# Patient Record
Sex: Male | Born: 1970
Health system: Southern US, Community
[De-identification: ages and names within clinical notes are randomized; demographics above are authoritative.]

## PROBLEM LIST (undated history)

## (undated) DIAGNOSIS — F419 Anxiety disorder, unspecified: Secondary | ICD-10-CM

## (undated) DIAGNOSIS — G9332 Myalgic encephalomyelitis/chronic fatigue syndrome: Secondary | ICD-10-CM

## (undated) DIAGNOSIS — M109 Gout, unspecified: Secondary | ICD-10-CM

## (undated) DIAGNOSIS — M25569 Pain in unspecified knee: Secondary | ICD-10-CM

## (undated) DIAGNOSIS — E119 Type 2 diabetes mellitus without complications: Secondary | ICD-10-CM

## (undated) DIAGNOSIS — M2550 Pain in unspecified joint: Secondary | ICD-10-CM

## (undated) DIAGNOSIS — I10 Essential (primary) hypertension: Secondary | ICD-10-CM

## (undated) HISTORY — DX: Essential (primary) hypertension: I10

## (undated) HISTORY — DX: Myalgic encephalomyelitis/chronic fatigue syndrome: G93.32

## (undated) HISTORY — DX: Anxiety disorder, unspecified: F41.9

## (undated) HISTORY — PX: BACK SURGERY: SHX140

## (undated) HISTORY — DX: Type 2 diabetes mellitus without complications: E11.9

## (undated) HISTORY — DX: Pain in unspecified knee: M25.569

## (undated) HISTORY — DX: Pain in unspecified joint: M25.50

---

## 2012-04-21 ENCOUNTER — Ambulatory Visit (HOSPITAL_BASED_OUTPATIENT_CLINIC_OR_DEPARTMENT_OTHER): Payer: BC Managed Care – PPO | Attending: Family Medicine | Admitting: Radiology

## 2012-04-21 VITALS — Ht 74.0 in | Wt 365.0 lb

## 2012-04-21 DIAGNOSIS — G4733 Obstructive sleep apnea (adult) (pediatric): Secondary | ICD-10-CM | POA: Insufficient documentation

## 2012-04-26 DIAGNOSIS — G4733 Obstructive sleep apnea (adult) (pediatric): Secondary | ICD-10-CM

## 2012-04-26 NOTE — Procedures (Signed)
[name], [name]                ACCOUNT NO.:  [id number]  MEDICAL RECORD NO.:  [id number]          PATIENT TYPE:  OUT  LOCATION:  SLEEP CENTER                 FACILITY:  [facility]  PHYSICIAN:  Dontai Pember D. Maple Hudson, MD, FCCP, FACPDATE OF BIRTH:  07/30/68  DATE OF STUDY:  04/21/2012                           NOCTURNAL POLYSOMNOGRAM  REFERRING PHYSICIAN:  Windle Guard, M.D.  REFERRING PHYSICIAN:  Windle Guard, MD  INDICATION FOR STUDY:  Insomnia with sleep apnea.  EPWORTH SLEEPINESS SCORE:  12/24.  BMI 46.9, weight 365 pounds, height 74 inches, neck 20 inches.  MEDICATIONS:  Home medications are charted and reviewed.  SLEEP ARCHITECTURE:  Split study protocol.  During the diagnostic phase, total sleep time 120.5 minutes with sleep efficiency 74.8%.  Stage I was 5.4%, stage II 94.6%, stages III and REM were absent.  Sleep latency 31.5 minutes, awake after sleep onset 9 minutes.  Arousal index 12.9. Bedtime medication:  Tylenol PM.  RESPIRATORY DATA:  Split study protocol.  Apnea/hypopnea index (AHI) 106.6 per hour.  A total of 214 events was scored including 152 obstructive apneas, 5 mixed apneas, 57 hypopneas.  Events were not positional.  CPAP was titrated to 11 CWP, AHI 0 per hour.  He wore a medium ResMed Quattro FX full-face mask with heated humidifier.  OXYGEN DATA:  Before CPAP, snoring was moderately loud with oxygen desaturation to a nadir of 76% on room air.  With CPAP titration, snoring was prevented and mean oxygen saturation held 94.7% on room air.  CARDIAC DATA:  Sinus rhythm.  MOVEMENT/PARASOMNIA:  No significant movement disturbance of sleep.  No bathroom trips.  IMPRESSION/RECOMMENDATION: 1. Severe obstructive sleep apnea/hypopnea syndrome, AHI 106.6 per     hour with non-positional events.  Moderately loud snoring with     oxygen desaturation to a nadir of 76% on room air. 2. Successful CPAP titration to 11 CWP, AHI 0 per hour.  He wore a     medium ResMed  Quattro FX full-face mask with heated humidifier.     Snoring was prevented and mean oxygen saturation held 94.7% on room     air.     Cameron Hunter D. Maple Hudson, MD, [facility], FACP Diplomate, American Board of Sleep Medicine    CDY/MEDQ  D:  04/26/2012 11:59:38  T:  04/26/2012 22:44:33  Job:  [id number]

## 2017-11-13 ENCOUNTER — Encounter (HOSPITAL_COMMUNITY): Payer: Self-pay | Admitting: Emergency Medicine

## 2017-11-13 ENCOUNTER — Ambulatory Visit (HOSPITAL_COMMUNITY)
Admission: EM | Admit: 2017-11-13 | Discharge: 2017-11-13 | Disposition: A | Discharge status: Home / Self Care | Payer: 59 | Attending: Family Medicine | Admitting: Family Medicine

## 2017-11-13 DIAGNOSIS — M25561 Pain in right knee: Secondary | ICD-10-CM | POA: Diagnosis not present

## 2017-11-13 HISTORY — DX: Gout, unspecified: M10.9

## 2017-11-13 MED ORDER — INDOMETHACIN 50 MG PO CAPS: 50.0000 mg | ORAL_CAPSULE | Freq: Three times a day (TID) | ORAL | 0 refills | Status: AC

## 2017-11-13 MED ORDER — PREDNISONE 50 MG PO TABS: 50.0000 mg | ORAL_TABLET | Freq: Every day | ORAL | 0 refills | Status: DC

## 2017-11-13 NOTE — ED Triage Notes (Signed)
Pt sts right knee pain; pt sts took gout meds without relief

## 2017-11-13 NOTE — ED Provider Notes (Signed)
[facility]    CSN: [id number] Arrival date & time: 11/13/17  1508     History   Chief Complaint Chief Complaint  Patient presents with  . Knee Pain    HPI Cameron Hunter is a 47 y.o. male.   47 year old male comes in for 6 day history of right knee pain. States has had gout in the knee in the past, at the time of symptoms, had erythema, increased warmth, swelling and tender to touch. He did an e-visit and got 5 days of indomethacin with good relief. However, when he started up activity again with mild beer intake, pain started back up. States pain relieved by RICE, pain increased with weight bearing. Denies injury/trauma. States has ran out of indomethacin and came in for evaluation. Denies fever, chills, night sweats. Patient job requires long hours of walking/standing.     Past Medical History:  Diagnosis Date  . Gout     There are no active problems to display for this patient.   History reviewed. No pertinent surgical history.     Home Medications    Prior to Admission medications   Medication Sig Start Date End Date Taking? Authorizing Provider  indomethacin (INDOCIN) 50 MG capsule Take 1 capsule (50 mg total) by mouth 3 (three) times daily with meals for 7 days. 11/13/17 11/20/17  Cathie Hoops, Zakiya Sporrer V, PA-C  predniSONE (DELTASONE) 50 MG tablet Take 1 tablet (50 mg total) by mouth daily for 5 days. 11/13/17 11/18/17  Belinda Fisher, PA-C    Family History History reviewed. No pertinent family history.  Social History Social History   Tobacco Use  . Smoking status: Never Smoker  . Smokeless tobacco: Never Used  Substance Use Topics  . Alcohol use: Not Currently  . Drug use: Never     Allergies   Patient has no known allergies.   Review of Systems Review of Systems  Reason unable to perform ROS: See HPI as above.     Physical Exam Triage Vital Signs ED Triage Vitals [11/13/17 1528]  Enc Vitals Group     BP (!) 185/93     Pulse Rate 79     Resp 18    Temp 98.6 F (37 C)     Temp Source Oral     SpO2 100 %     Weight      Height      Head Circumference      Peak Flow      Pain Score      Pain Loc      Pain Edu?      Excl. in GC?    No data found.  Updated Vital Signs BP (!) 185/93 (BP Location: Left Arm)   Pulse 79   Temp 98.6 F (37 C) (Oral)   Resp 18   SpO2 100%   Physical Exam  Constitutional: He is oriented to person, place, and time. He appears well-developed and well-nourished. No distress.  HENT:  Head: Normocephalic and atraumatic.  Eyes: Pupils are equal, round, and reactive to light. Conjunctivae are normal.  Musculoskeletal:  No obvious swelling, erythema, increased warmth, contusion seen.  No obvious tenderness to palpation.  Full range of motion.  Strength normal and equal bilaterally.  Sensation intact and equal bilaterally.  Neurological: He is alert and oriented to person, place, and time.     UC Treatments / Results  Labs (all labs ordered are listed, but only abnormal results are displayed) Labs Reviewed -  No data to display  EKG None  Radiology No results found.  Procedures Procedures (including critical care time)  Medications Ordered in UC Medications - No data to display  Initial Impression / Assessment and Plan / UC Course  I have reviewed the triage vital signs and the nursing notes.  Pertinent labs & imaging results that were available during my care of the patient were reviewed by me and considered in my medical decision making (see chart for details).    Atraumatic knee pain, discussed possibility of residual gout, inflammation, arthritis causing symptoms. Will extend indomethacin treatment. Rx of prednisone as needed. Knee sleeve during activity. Return precautions given. Patient expresses understanding and agrees to plan.  Final Clinical Impressions(s) / UC Diagnoses   Final diagnoses:  Acute pain of right knee    ED Prescriptions    Medication Sig Dispense Auth.  Provider   indomethacin (INDOCIN) 50 MG capsule Take 1 capsule (50 mg total) by mouth 3 (three) times daily with meals for 7 days. 21 capsule Daymen Hassebrock V, PA-C   predniSONE (DELTASONE) 50 MG tablet Take 1 tablet (50 mg total) by mouth daily for 5 days. 5 tablet Threasa Alpha, [place] 11/13/17 1615

## 2017-11-13 NOTE — Discharge Instructions (Signed)
Start indomethacin as directed. Continue elevation, knee sleeve during activity. Prednisone if symptoms not improving. Follow up fore reevaluation if symptoms not improving.

## 2017-11-15 ENCOUNTER — Telehealth (HOSPITAL_COMMUNITY): Payer: Self-pay | Admitting: Family Medicine

## 2017-11-15 MED ORDER — PREDNISONE 50 MG PO TABS: 50.0000 mg | ORAL_TABLET | Freq: Every day | ORAL | 0 refills | Status: AC

## 2017-11-15 NOTE — Telephone Encounter (Signed)
Pt called wanting the prescription for prednisone. Reports that he was tols if the indomethacin didn't work to call and we would send the prednisone. Looked at the chart and appears that it was already sent to the pharmacy on the same date as the indomethacin. Pt reports that they didn't receive it. I will resend med to pharmacy for the patient. Pt aware and agreeable to plan.

## 2018-01-09 ENCOUNTER — Encounter (HOSPITAL_COMMUNITY): Payer: Self-pay | Admitting: Emergency Medicine

## 2018-01-09 ENCOUNTER — Ambulatory Visit (HOSPITAL_COMMUNITY)
Admission: EM | Admit: 2018-01-09 | Discharge: 2018-01-09 | Disposition: A | Discharge status: Home / Self Care | Payer: 59 | Attending: Family Medicine | Admitting: Family Medicine

## 2018-01-09 DIAGNOSIS — M25561 Pain in right knee: Secondary | ICD-10-CM

## 2018-01-09 MED ORDER — PREDNISONE 10 MG (21) PO TBPK: ORAL_TABLET | Freq: Every day | ORAL | 0 refills | Status: DC

## 2018-01-09 NOTE — ED Triage Notes (Signed)
PT reports right knee pain for over a week. PT's doctor increased his gout med and it has not helped. No known injury.

## 2018-01-23 NOTE — ED Provider Notes (Signed)
[facility]   [id number] 01/09/18 Arrival Time: 1726  ASSESSMENT & PLAN:  1. Acute pain of right knee     Meds ordered this encounter  Medications  . predniSONE (STERAPRED UNI-PAK 21 TAB) 10 MG (21) TBPK tablet    Sig: Take by mouth daily. Take as directed.    Dispense:  21 tablet    Refill:  0    Follow-up Information    Kaleen Mask, MD.   Specialty:  Family Medicine Why:  As needed. Contact information: [address] [place] [address] [phone]           Reviewed expectations re: course of current medical issues. Questions answered. Outlined signs and symptoms indicating need for more acute intervention. Patient verbalized understanding. After Visit Summary given.  SUBJECTIVE: History from: patient. Kartik Fernando is a 47 y.o. male who reports persistent moderate pain of his right knee that is gradually worsening; described as aching without radiation. Onset: gradual, over the past week but has had similar problem with this knee in the past. Injury/trama: no. Relieved by: nothing in particular. Worsened by: certain movements. Associated symptoms: none reported. Extremity sensation changes or weakness: none. Self treatment: tried OTCs without relief of pain. History of similar: yes His doctor recently gave Colchicine for suspected gout. This not helping.  ROS: As per HPI.   OBJECTIVE:  Vitals:   01/09/18 1740  BP: (!) 165/96  Pulse: 83  Resp: 16  Temp: 97.7 F (36.5 C)  TempSrc: Oral  SpO2: 97%  Weight: 129.3 kg    General appearance: alert; no distress Extremities: warm and well perfused; symmetrical with no gross deformities; poorly localized tenderness over his right knee with no swelling and no bruising; ROM: normal but with discomfort CV: normal extremity capillary refill Skin: warm and dry Neurologic: normal gait; normal symmetric reflexes in all extremities; normal sensation in all  extremities Psychological: alert and cooperative; normal mood and affect  No Known Allergies  Past Medical History:  Diagnosis Date  . Gout    Social History   Socioeconomic History  . Marital status: Married    Spouse name: Not on file  . Number of children: Not on file  . Years of education: Not on file  . Highest education level: Not on file  Occupational History  . Not on file  Social Needs  . Financial resource strain: Not on file  . Food insecurity:    Worry: Not on file    Inability: Not on file  . Transportation needs:    Medical: Not on file    Non-medical: Not on file  Tobacco Use  . Smoking status: Never Smoker  . Smokeless tobacco: Never Used  Substance and Sexual Activity  . Alcohol use: Not Currently  . Drug use: Never  . Sexual activity: Not on file  Lifestyle  . Physical activity:    Days per week: Not on file    Minutes per session: Not on file  . Stress: Not on file  Relationships  . Social connections:    Talks on phone: Not on file    Gets together: Not on file    Attends religious service: Not on file    Active member of club or organization: Not on file    Attends meetings of clubs or organizations: Not on file    Relationship status: Not on file  . Intimate partner violence:    Fear of current or ex partner: Not on file  Emotionally abused: Not on file    Physically abused: Not on file    Forced sexual activity: Not on file  Other Topics Concern  . Not on file  Social History Narrative  . Not on file   No family history on file. History reviewed. No pertinent surgical history.    Mardella LaymanHagler, Jessicia Napolitano, MD 01/28/18 [phone]

## 2018-05-14 ENCOUNTER — Emergency Department (HOSPITAL_COMMUNITY): Payer: 59

## 2018-05-14 ENCOUNTER — Encounter (HOSPITAL_COMMUNITY): Payer: Self-pay | Admitting: Emergency Medicine

## 2018-05-14 ENCOUNTER — Emergency Department (HOSPITAL_COMMUNITY)
Admission: EM | Admit: 2018-05-14 | Discharge: 2018-05-14 | Disposition: A | Discharge status: Home / Self Care | Payer: 59 | Attending: Emergency Medicine | Admitting: Emergency Medicine

## 2018-05-14 DIAGNOSIS — M25561 Pain in right knee: Secondary | ICD-10-CM | POA: Diagnosis not present

## 2018-05-14 DIAGNOSIS — M5416 Radiculopathy, lumbar region: Secondary | ICD-10-CM | POA: Insufficient documentation

## 2018-05-14 DIAGNOSIS — Z79899 Other long term (current) drug therapy: Secondary | ICD-10-CM | POA: Insufficient documentation

## 2018-05-14 MED ORDER — METHYLPREDNISOLONE SODIUM SUCC 125 MG IJ SOLR
125.0000 mg | Freq: Once | INTRAMUSCULAR | Status: AC
  Administered 2018-05-14: 125 mg via INTRAVENOUS
  Filled 2018-05-14: qty 2

## 2018-05-14 MED ORDER — ONDANSETRON HCL 4 MG/2ML IJ SOLN
4.0000 mg | Freq: Once | INTRAMUSCULAR | Status: AC
  Administered 2018-05-14: 4 mg via INTRAVENOUS
  Filled 2018-05-14: qty 2

## 2018-05-14 MED ORDER — HYDROMORPHONE HCL 1 MG/ML IJ SOLN
1.0000 mg | Freq: Once | INTRAMUSCULAR | Status: AC
  Administered 2018-05-14: 1 mg via INTRAVENOUS
  Filled 2018-05-14: qty 1

## 2018-05-14 MED ORDER — SODIUM CHLORIDE 0.9 % IV BOLUS
1000.0000 mL | Freq: Once | INTRAVENOUS | Status: AC
  Administered 2018-05-14: 1000 mL via INTRAVENOUS

## 2018-05-14 MED ORDER — OXYCODONE-ACETAMINOPHEN 5-325 MG PO TABS: 1.0000 | ORAL_TABLET | Freq: Four times a day (QID) | ORAL | 0 refills | Status: DC | PRN

## 2018-05-14 MED ORDER — KETOROLAC TROMETHAMINE 30 MG/ML IJ SOLN
30.0000 mg | Freq: Once | INTRAMUSCULAR | Status: AC
  Administered 2018-05-14: 30 mg via INTRAVENOUS
  Filled 2018-05-14: qty 1

## 2018-05-14 NOTE — Discharge Instructions (Signed)
You have been seen today for knee pain. There were no acute abnormalities on the x-rays, including no sign of fracture or dislocation.   Antiinflammatory medications: Take the indomethacin, as prescribed. Acetaminophen (generic for Tylenol): Should you continue to have additional pain while taking the indomethacin, you may add in acetaminophen as needed. Your daily total maximum amount of acetaminophen from all sources should be limited to 4000mg /day for persons without liver problems, or 2000mg /day for those with liver problems. Percocet: May take Percocet (oxycodone-acetaminophen) as needed for severe pain.  Do not drive or perform other dangerous activities while taking the Percocet.  Please note that each pill of Percocet contains 325 mg of acetaminophen (Tylenol) and the above dosage limits apply. Ice: May apply ice to the area over the next 24 hours for 15 minutes at a time to reduce swelling. Elevation: Keep the extremity elevated as often as possible to reduce pain and inflammation. Support: Wear the knee immobilizer for support and comfort. Wear this until pain resolves. You will be weight-bearing as tolerated, which means you can slowly start to put weight on the extremity and increase amount and frequency as pain allows. Follow up: Follow-up with the orthopedic specialist for any further management of this issue. Return: Return to the ED for numbness, weakness, increasing pain, overall worsening symptoms, loss of function, or if symptoms are not improving, you have tried to follow up with the orthopedic specialist, and have been unable to do so.  For prescription assistance, may try using prescription discount sites or apps, such as goodrx.com

## 2018-05-14 NOTE — ED Notes (Signed)
Pt verbalized understanding discharge instructions and denies any further needs or questions at this time. VS stable, ambulatory and steady gait.   See EDP assessment / note.   

## 2018-05-14 NOTE — ED Provider Notes (Signed)
[facility] EMERGENCY DEPARTMENT Provider Note   CSN: [id number] Arrival date & time: 05/14/18  2130     History   Chief Complaint Chief Complaint  Patient presents with  . Knee Pain    HPI Cameron Hunter is a 47 y.o. male.  HPI   Cameron Hunter is a 47 y.o. male, with a history of gout, presenting to the ED with right knee pain for the last 3 days. Pain is sharp, severe, nonradiating.  Patient states he began to have right lower back pain 2 days ago, moderate, radiating down the right leg.  Patient had cortisone injection performed on the right knee by Dr. Ranell Patrick at the beginning of this month, which improved his recurrent right knee pain, but pain became worse 3 days ago.  Patient was seen by a PA in Dr. Tobin Chad office this morning for his knee pain and was injected with a "numbing medication" in the right knee, but states this has not helped. He was reportedly told that he could come to the ED for "more immediate relief." Patient does state his pain is consistent with previous gout flares in terms of type of pain and onset.  He adds that his current pain is more intense, but he does admit he has not let any previous flares go on this long.Marland Kitchen  He has had gout flares in multiple joints of the lower extremities, though he has not had a gout flare in the knee in the past.  He has recently eaten some of his trigger foods, including pork.  Orthopedic PA saw the patient this morning also suspected possible gout flare as he prescribed the patient prednisone and indomethacin.  Denies fever/chills, abdominal pain, numbness, weakness, falls/trauma, changes in bowel or bladder function, saddle anesthesias, or any other complaints.   Past Medical History:  Diagnosis Date  . Gout     There are no active problems to display for this patient.   History reviewed. No pertinent surgical history.      Home Medications    Prior to Admission medications   Medication Sig  Start Date End Date Taking? Authorizing Provider  Glucosamine 750 MG TABS Take 1,500 mg by mouth daily.   Yes [provider]  HYDROcodone-acetaminophen (NORCO/VICODIN) 5-325 MG tablet Take 1 tablet by mouth 4 (four) times daily as needed for pain. 05/13/18  Yes [provider]  Melatonin 5 MG TABS Take 10-15 mg by mouth at bedtime.   Yes [provider]  VOLTAREN 1 % GEL Apply 1 application topically daily as needed for pain. Right knee 04/24/18  Yes [provider]  oxyCODONE-acetaminophen (PERCOCET/ROXICET) 5-325 MG tablet Take 1-2 tablets by mouth every 6 (six) hours as needed for severe pain. 05/14/18   Nieves Barberi, Hillard Danker, PA-C    Family History No family history on file.  Social History Social History   Tobacco Use  . Smoking status: Never Smoker  . Smokeless tobacco: Never Used  Substance Use Topics  . Alcohol use: Not Currently  . Drug use: Never     Allergies   Patient has no known allergies.   Review of Systems Review of Systems  Constitutional: Negative for fever.  Respiratory: Negative for shortness of breath.   Cardiovascular: Negative for chest pain.  Gastrointestinal: Negative for nausea and vomiting.  Genitourinary: Negative for difficulty urinating.  Musculoskeletal: Positive for arthralgias. Negative for joint swelling.  Neurological: Negative for weakness and numbness.  All other systems reviewed and are negative.  Physical Exam Updated Vital Signs BP (!) 199/130 (BP Location: Left Arm)   Pulse 97   Temp 98.7 F (37.1 C) (Oral)   Resp 20   SpO2 97%   Physical Exam  Constitutional: He appears well-developed and well-nourished. No distress.  HENT:  Head: Normocephalic and atraumatic.  Eyes: Conjunctivae are normal.  Neck: Neck supple.  Cardiovascular: Normal rate, regular rhythm and intact distal pulses.  Pulmonary/Chest: Effort normal. No respiratory distress.  Abdominal: There is no guarding.  Musculoskeletal:  He exhibits tenderness. He exhibits no edema.  Some pain with range of motion of the right knee.  Range of motion intact.  Some tenderness throughout the right knee.  No swelling, erythema, increased warmth, deformity, or laxity noted. No pain with range of motion of the right hip. No tenderness in the lower back or hips.  Neurological: He is alert.  Sensation grossly intact to light touch in the lower extremities bilaterally. No saddle anesthesias. Strength 5/5 with flexion and extension at the bilateral hips, knees, and ankles. Slow, antalgic gait.  Skin: Skin is warm and dry. He is not diaphoretic.  Psychiatric: He has a normal mood and affect. His behavior is normal.  Nursing note and vitals reviewed.    ED Treatments / Results  Labs (all labs ordered are listed, but only abnormal results are displayed) Labs Reviewed - No data to display  EKG None  Radiology Dg Knee Complete 4 Views Right  Result Date: 05/14/2018 CLINICAL DATA:  Severe right knee pain. No response to steroids. No recent injury. EXAM: RIGHT KNEE - COMPLETE 4+ VIEW COMPARISON:  None. FINDINGS: The mineralization and alignment are normal. There is no evidence of acute fracture or dislocation. There is tricompartmental osteoarthritis, most advanced in the medial compartment where there is mild joint space narrowing and osteophyte formation. There is spurring at the quadriceps insertion on the patella. No significant joint effusion. IMPRESSION: Moderate for age tricompartmental osteoarthritis. No evidence of acute fracture or dislocation. Electronically Signed   By: Carey Bullocks M.D.   On: 05/14/2018 13:15    Procedures Procedures (including critical care time)  Medications Ordered in ED Medications  HYDROmorphone (DILAUDID) injection 1 mg (1 mg Intravenous Given 05/14/18 0951)  ondansetron (ZOFRAN) injection 4 mg (4 mg Intravenous Given 05/14/18 0951)  ketorolac (TORADOL) 30 MG/ML injection 30 mg (30 mg  Intravenous Given 05/14/18 1001)  sodium chloride 0.9 % bolus 1,000 mL (0 mLs Intravenous Stopped 05/14/18 1122)  methylPREDNISolone sodium succinate (SOLU-MEDROL) 125 mg/2 mL injection 125 mg (125 mg Intravenous Given 05/14/18 1018)  HYDROmorphone (DILAUDID) injection 1 mg (1 mg Intravenous Given 05/14/18 1122)  HYDROmorphone (DILAUDID) injection 1 mg (1 mg Intravenous Given 05/14/18 1327)     Initial Impression / Assessment and Plan / ED Course  I have reviewed the triage vital signs and the nursing notes.  Pertinent labs & imaging results that were available during my care of the patient were reviewed by me and considered in my medical decision making (see chart for details).  Clinical Course as of May 15 542  Wed May 14, 2018  0950 Pain improved somewhat, but then recurred.   [SJ]  1505 Spoke with Alphonsa Overall, orthopedic PA working with Dr. Ranell Patrick. Agrees with our plan and follow up.  They would be happy to see the patient in the office.   [SJ]    Clinical Course User Index [SJ] Jonai Weyland C, PA-C   Patient presents with right knee pain.  He has full  range of motion in the right knee.  Neurovascularly intact.  No features of the patient's back pain to suggest cauda equina or other emergent neurologic condition. Very low suspicion for septic joint as the exam and timeline are not suggestive.   Gout is much higher on my differential.  He had significant improvement in his pain over the ED course.  Findings and plan of care discussed with Marily MemosJason Mesner, MD.   The patient and I discussed his hypertension.  He does not seem to be symptomatic to it at this time.  He will follow-up with his PCP on this matter.  Final Clinical Impressions(s) / ED Diagnoses   Final diagnoses:  Acute pain of right knee    ED Discharge Orders         Ordered    oxyCODONE-acetaminophen (PERCOCET/ROXICET) 5-325 MG tablet  Every 6 hours PRN     05/14/18 1508           Anselm PancoastJoy, Parlee Amescua C, PA-C 05/15/18  0544    Mesner, Barbara CowerJason, MD 05/15/18 [phone]

## 2018-05-14 NOTE — ED Triage Notes (Signed)
Patient to ED c/o severe R knee pain - seen at his doctor for possible osteoarthritis and given steroid shot without relief. Reports the knee pain shoots up R leg to back, denies new injury. Denies numbness, but states unable to move/walk d/t severe pain.

## 2018-06-20 ENCOUNTER — Ambulatory Visit: Payer: 59 | Admitting: Neurology

## 2018-06-20 ENCOUNTER — Telehealth: Payer: Self-pay | Admitting: *Deleted

## 2018-06-20 ENCOUNTER — Encounter: Payer: Self-pay | Admitting: Neurology

## 2018-06-20 VITALS — BP 128/86 | HR 83

## 2018-06-20 DIAGNOSIS — M79604 Pain in right leg: Secondary | ICD-10-CM | POA: Diagnosis not present

## 2018-06-20 MED ORDER — MELOXICAM 15 MG PO TABS: 15.0000 mg | ORAL_TABLET | Freq: Every day | ORAL | 2 refills | Status: DC

## 2018-06-20 NOTE — Progress Notes (Signed)
Reason for visit: Right knee pain  Referring physician: Dr. Arthur HolmsNorris  Cameron Hunter is a 48 y.o. male  History of present illness:  [name] is a 48 year old right-handed white male with a history of onset of right knee pain that began in June 2019.  The patient does have a prior history of gout, initially the patient had a swollen hot right knee joint that seemed to respond to prednisone and indomethacin.  The patient felt better until August 2019 and he then had a second bout of right knee pain again with some warmth and swelling.  The patient was again treated with a cortisone injection which seemed to help.  He did well until 10 April 2018.  The patient began having severe pain at that point in the right knee, he had MRI of the knee but was told he had mild arthritic changes, nothing that would require surgery.  The patient had worsening the pain by 7 November and once again by 24 November the pain worsened.  The patient went to the emergency room on 27 November and received IV fluids and Dilaudid without much benefit.  The patient was placed on Lyrica by 27 May 2018.  He had MRI of the lumbar spine that did show evidence of mild to moderate spinal stenosis at the L2-3 level with possible impingement of the L3 and L4 nerve roots bilaterally.  The patient has gained benefit from the Lyrica after about 3 weeks of treatment.  He is feeling better with his right knee but he is still not ambulatory.  The patient comes in today in a wheelchair.  He denies numbness of the extremities, he denies neck pain or pain down the arms.  The patient denies any back pain whatsoever.  He has some achiness at times into the lower thigh on the right but he denies pain going from the back down the leg to the foot.  He reports no definite weakness of the extremities.  The patient is sent to this office for further evaluation.  The pain in the right knee is present with moving the knee and with  weightbearing.  Past Medical History:  Diagnosis Date  . Gout   . Knee pain    Right knee     History reviewed. No pertinent surgical history.  Family History  Problem Relation Age of Onset  . Lung cancer Mother   . Diabetes Mother   . COPD Mother   . Heart disease Mother   . Kidney disease Mother     Social history:  reports that he has never smoked. He has never used smokeless tobacco. He reports previous alcohol use. He reports that he does not use drugs.  Medications:  Prior to Admission medications   Medication Sig Start Date End Date Taking? Authorizing Provider  Glucosamine 750 MG TABS Take 1,500 mg by mouth daily.    [provider]  HYDROcodone-acetaminophen (NORCO/VICODIN) 5-325 MG tablet Take 1 tablet by mouth 4 (four) times daily as needed for pain. 05/13/18   [provider]  Melatonin 5 MG TABS Take 10-15 mg by mouth at bedtime.    [provider]  oxyCODONE-acetaminophen (PERCOCET/ROXICET) 5-325 MG tablet Take 1-2 tablets by mouth every 6 (six) hours as needed for severe pain. 05/14/18   Joy, Shawn C, PA-C  VOLTAREN 1 % GEL Apply 1 application topically daily as needed for pain. Right knee 04/24/18   [provider]     No Known Allergies  ROS:  Out of a complete 14 system review of symptoms, the patient complains only of the following symptoms, and all other reviewed systems are negative.  Right knee pain  Blood pressure 128/86, pulse 83, SpO2 95 %.  Physical Exam  General: The patient is alert and cooperative at the time of the examination.  Eyes: Pupils are equal, round, and reactive to light. Discs are flat bilaterally.  Neck: The neck is supple, no carotid bruits are noted.  Respiratory: The respiratory examination is clear.  Cardiovascular: The cardiovascular examination reveals a regular rate and rhythm, no obvious murmurs or rubs are noted.  Skin: Extremities are without significant edema.  There may be a  small amount of fluid in the right knee, but this is minimal, no definite warmth of the knee is seen.  Neurologic Exam  Mental status: The patient is alert and oriented x 3 at the time of the examination. The patient has apparent normal recent and remote memory, with an apparently normal attention span and concentration ability.  Cranial nerves: Facial symmetry is present. There is good sensation of the face to pinprick and soft touch bilaterally. The strength of the facial muscles and the muscles to head turning and shoulder shrug are normal bilaterally. Speech is well enunciated, no aphasia or dysarthria is noted. Extraocular movements are full. Visual fields are full. The tongue is midline, and the patient has symmetric elevation of the soft palate. No obvious hearing deficits are noted.  Motor: The motor testing reveals 5 over 5 strength of all 4 extremities. Good symmetric motor tone is noted throughout.  Sensory: Sensory testing is intact to pinprick, soft touch, vibration sensation, and position sense on all 4 extremities. No evidence of extinction is noted.  Coordination: Cerebellar testing reveals good finger-nose-finger and heel-to-shin bilaterally.  Gait and station: The patient is able stand and take a few steps, he is hesitant to ambulate longer distances.  Reflexes: Deep tendon reflexes are symmetric and normal bilaterally, with exception that the right knee jerk reflex appears to be depressed. Toes are downgoing bilaterally.   MRI lumbar 05/27/18:  1.  A transitional lumbosacral vertebra which is called L5 for the purposes of study.  If surgery or any other procedure is contemplated the numbering system that was used for the study should be carefully scrutinized. 2.  Multilevel degenerative changes of the lumbar spine, most prominent at L2-3. 3.  At L2-3, mild to moderate central canal stenosis with probable encroachment on the L3 nerve roots. 4.  At L3-4, a posterior disc  osteophyte complex with superimposed small central caudal disc extrusion.  The disc contacts but does not displace the L4 nerve roots. 5.  At L4-5, mild bilateral lateral recess stenosis.  No neural displacement or encroachment. 6.  Probable chronic lumbar spinous process impingement at L1-2, L2-3, and L3-4.  Recommend clinical correlation.   Assessment/Plan:  1.  Right knee discomfort  The clinical history appears to suggest an intrinsic knee joint problem.  The patient does have a history of gout, currently the right knee does not appear to be inflamed.  The pain level has been severe, the patient has not been ambulatory for several weeks.  The patient be placed on Mobic 15 mg daily.  He will remain on Lyrica, we will get him set up for an epidural steroid injection of the low back although I am not convinced that his low back issues are in any way associated with the right knee pain.  We  may potentially consider EMG nerve conduction study in the future depending upon how he does with the above therapy.  Blood work will be done today.  Marlan Palau MD 06/20/2018 8:55 AM  [facility] [address] [place], [place] [address]  Phone [phone] Fax [phone]

## 2018-06-20 NOTE — Telephone Encounter (Signed)
I contacted Cameron Hunter(pt wife ok per dpr) and advised she could bring FMLA paper work by for Dr. Anne Hahn to review and we would go from there. Pt's wife was agreeable and had no further questions MB RN

## 2018-06-20 NOTE — Telephone Encounter (Signed)
Diannia Ruder, patients wife, came to the front desk after the appointment today with questions about possibly getting a letter from Dr. Anne Hahn.  States she wonders if she needs it for FMLA?  Please call.

## 2018-06-23 ENCOUNTER — Other Ambulatory Visit: Payer: Self-pay

## 2018-06-23 ENCOUNTER — Other Ambulatory Visit: Payer: Self-pay | Admitting: Neurology

## 2018-06-23 ENCOUNTER — Telehealth: Payer: Self-pay

## 2018-06-23 DIAGNOSIS — M79604 Pain in right leg: Secondary | ICD-10-CM

## 2018-06-23 LAB — COMPREHENSIVE METABOLIC PANEL
A/G RATIO: 1.9 (ref 1.2–2.2)
ALBUMIN: 4.2 g/dL (ref 3.5–5.5)
ALK PHOS: 57 IU/L (ref 39–117)
ALT: 27 IU/L (ref 0–44)
AST: 19 IU/L (ref 0–40)
BILIRUBIN TOTAL: 0.4 mg/dL (ref 0.0–1.2)
BUN/Creatinine Ratio: 11 (ref 9–20)
BUN: 9 mg/dL (ref 6–24)
CALCIUM: 9.4 mg/dL (ref 8.7–10.2)
CHLORIDE: 97 mmol/L (ref 96–106)
CO2: 25 mmol/L (ref 20–29)
Creatinine, Ser: 0.85 mg/dL (ref 0.76–1.27)
GFR, EST AFRICAN AMERICAN: 118 mL/min/{1.73_m2} (ref >59)
GFR, EST NON AFRICAN AMERICAN: 102 mL/min/{1.73_m2} (ref >59)
Globulin, Total: 2.2 g/dL (ref 1.5–4.5)
Glucose: 103 mg/dL — ABNORMAL HIGH (ref 65–99)
Potassium: 4.1 mmol/L (ref 3.5–5.2)
Sodium: 142 mmol/L (ref 134–144)
TOTAL PROTEIN: 6.4 g/dL (ref 6.0–8.5)

## 2018-06-23 LAB — SEDIMENTATION RATE: Sed Rate: 10 mm/hr (ref 0–15)

## 2018-06-23 LAB — ANA W/REFLEX: Anti Nuclear Antibody(ANA): NEGATIVE

## 2018-06-23 LAB — RHEUMATOID FACTOR: Rhuematoid fact SerPl-aCnc: <10 IU/mL (ref 0.0–13.9)

## 2018-06-23 LAB — ANGIOTENSIN CONVERTING ENZYME: Angio Convert Enzyme: 30 U/L (ref 14–82)

## 2018-06-23 LAB — B. BURGDORFI ANTIBODIES: Lyme IgG/IgM Ab: <0.91 {ISR} (ref 0.00–0.90)

## 2018-06-23 NOTE — Telephone Encounter (Signed)
Pt's wife Diannia Ruder (ok per [facility]) Dropped off FMLA paper work completed by Dr. Malon Kindle, Orthopedic on 06/20/2017 for our office to review. After reviewing the paper work, Dr. Ranell Patrick had agreed to write the pt out of work from 05/21/2018-08/11/18. At this time no new information needs to be added to the [facility] paper work from Dr. Anne Hahn.  I contacted Diannia Ruder and advised of this, she was agreeable. She states Rosann Auerbach is in need of the ov note from 06/20/17 with Dr. Anne Hahn. I advised Diannia Ruder to obtain the information from [place] and verify where to fax and then call our office back and ask to speak with medical records to relay information, she was agreeable.  Pt's wife had no further questions/concers at this time MB RN.

## 2018-06-23 NOTE — Telephone Encounter (Signed)
-----   Message from York Spaniel, MD sent at 06/23/2018  4:41 PM EST -----  The blood work results are unremarkable. Please call the patient. ----- Message ----- From: Nell Range Lab Results In Sent: 06/21/2018   7:38 AM EST To: York Spaniel, MD

## 2018-06-24 MED ORDER — PREGABALIN 50 MG PO CAPS: ORAL_CAPSULE | ORAL | 4 refills | Status: DC

## 2018-06-24 NOTE — Addendum Note (Signed)
Addended by: Ann Maki T on: 06/24/2018 10:52 AM   Modules accepted: Orders

## 2018-06-24 NOTE — Telephone Encounter (Addendum)
I contacted the pt's wife Kara(ok per DPR) and advised of results. She verbalized understanding and states the pt's epidural injection has been scheduled for 07/01/18.  She is requesting a refill of the Lyrica 50 mg 2 tablets bid to be sent to [facility] on [address].

## 2018-06-25 ENCOUNTER — Encounter: Payer: Self-pay | Admitting: Neurology

## 2018-06-25 ENCOUNTER — Telehealth: Payer: Self-pay | Admitting: Neurology

## 2018-06-25 NOTE — Telephone Encounter (Signed)
I will dictate a letter. 

## 2018-06-25 NOTE — Telephone Encounter (Signed)
Pt's wife is calling stating her husband is needing a letter saying why he was out on the 3rd of jan date of his appt from work and why he needs to continue to be out of work. Please advise.

## 2018-06-26 NOTE — Telephone Encounter (Signed)
I contacted the pt's wife Kara(ok per dpr) and lvm  advising letter is ready for pick up. Letter placed up front at [facility] for pick up.

## 2018-06-27 ENCOUNTER — Telehealth: Payer: Self-pay | Admitting: Neurology

## 2018-06-27 NOTE — Telephone Encounter (Signed)
The disc of the MRI of the lumbar spine was provided for my review.  I do not have a written report, but there is no evidence of significant spinal stenosis.  The written report suggests possible impingement of the L3 nerve roots, structure the low back appears to be relatively unremarkable by my review.

## 2018-07-01 ENCOUNTER — Other Ambulatory Visit: Payer: Self-pay | Admitting: Neurology

## 2018-07-01 ENCOUNTER — Ambulatory Visit
Admission: RE | Admit: 2018-07-01 | Discharge: 2018-07-01 | Disposition: A | Discharge status: Home / Self Care | Payer: 59 | Source: Ambulatory Visit | Attending: Neurology | Admitting: Neurology

## 2018-07-01 DIAGNOSIS — M79604 Pain in right leg: Secondary | ICD-10-CM

## 2018-07-01 MED ORDER — IOPAMIDOL (ISOVUE-M 200) INJECTION 41%
1.0000 mL | Freq: Once | INTRAMUSCULAR | Status: AC
  Administered 2018-07-01: 1 mL via EPIDURAL

## 2018-07-01 MED ORDER — METHYLPREDNISOLONE ACETATE 40 MG/ML INJ SUSP (RADIOLOG
120.0000 mg | Freq: Once | INTRAMUSCULAR | Status: AC
  Administered 2018-07-01: 120 mg via EPIDURAL

## 2018-07-01 NOTE — Discharge Instructions (Signed)

## 2018-07-14 ENCOUNTER — Telehealth: Payer: Self-pay

## 2018-07-14 NOTE — Telephone Encounter (Signed)
I called the patient.  His actual cell phone number is [phone].  The patient has apparently gotten benefit from the epidural steroid injection, we can hold off on the EMG and nerve conduction study for now.  The patient may start walking short distances, would be very slow to start with exercise.

## 2018-07-14 NOTE — Telephone Encounter (Signed)
Pt called in wanting to advise MD he received his epidural injection on 06/30/18 and has responded well. Pt states since receiving injection and along with Lyrica/mobic dosage he has been more mobile and is feeling well.  Pt wanted to know if it would be ok for him to start walking on the treadmill to build up his leg strength, I advised this would be appropriate. Pt advised to slowly build up walking on the treadmill and not to overexert. Pt was agreeable.   Pt also wanted to verify if the MD would be recommending the EMG nerve conduction test as discuss in o/v from 06/20/18? I advised pt I would send to MD to clarify if this. Pt was agreeable.

## 2018-07-28 ENCOUNTER — Telehealth: Payer: Self-pay | Admitting: Neurology

## 2018-07-28 DIAGNOSIS — R29898 Other symptoms and signs involving the musculoskeletal system: Secondary | ICD-10-CM

## 2018-07-28 NOTE — Telephone Encounter (Signed)
I called the patient.  The patient is planning on getting back to work in a few weeks, he feels that his legs are somewhat weak and deconditioned, I will try to get him started on physical therapy.  His pain level is minimal at this point.

## 2018-07-28 NOTE — Telephone Encounter (Signed)
Pt would like to know if he can be referred to PT. He states his leggs are feeling week. Please advise.

## 2018-08-04 ENCOUNTER — Telehealth: Payer: Self-pay | Admitting: Neurology

## 2018-08-04 ENCOUNTER — Encounter: Payer: Self-pay | Admitting: Neurology

## 2018-08-04 DIAGNOSIS — M5416 Radiculopathy, lumbar region: Secondary | ICD-10-CM

## 2018-08-04 NOTE — Telephone Encounter (Signed)
I called the patient.  The patient is doing some better with the foot pain, I will dictate a letter for him to return back to work, he will come by and pick it up in 2 days.

## 2018-08-04 NOTE — Telephone Encounter (Signed)
Pt states he is wanting to start work on 08/11/18 and is needing a release form or letter stating that he has no limitations. Please advise.

## 2018-08-06 ENCOUNTER — Other Ambulatory Visit: Payer: Self-pay

## 2018-08-06 ENCOUNTER — Ambulatory Visit: Payer: 59 | Attending: Neurology | Admitting: Physical Therapy

## 2018-08-06 ENCOUNTER — Encounter: Payer: Self-pay | Admitting: Physical Therapy

## 2018-08-06 DIAGNOSIS — M545 Low back pain, unspecified: Secondary | ICD-10-CM

## 2018-08-06 DIAGNOSIS — M6281 Muscle weakness (generalized): Secondary | ICD-10-CM | POA: Diagnosis present

## 2018-08-06 DIAGNOSIS — R293 Abnormal posture: Secondary | ICD-10-CM | POA: Insufficient documentation

## 2018-08-06 NOTE — Telephone Encounter (Signed)
Letter placed up front at [facility].

## 2018-08-06 NOTE — Therapy (Signed)
[facility] [address] [place], [place], [address] Phone: [phone]   Fax:  [phone]  Physical Therapy Evaluation  Patient Details  Name: Cameron Hunter MRN: [id number] Date of Birth: 1970-11-26 Referring Provider (PT): Dr. Thong Feeny Hunter   Encounter Date: 08/06/2018  PT End of Session - 08/06/18 1038    Visit Number  1    Number of Visits  6    Date for PT Re-Evaluation  09/03/18    Authorization Type  [employer]    PT Start Time  [phone]    PT Stop Time  0930    PT Time Calculation (min)  43 min    Activity Tolerance  Patient tolerated treatment well    Behavior During Therapy  [facility] for tasks assessed/performed       Past Medical History:  Diagnosis Date  . Gout   . Knee pain    Right knee     History reviewed. No pertinent surgical history.  There were no vitals filed for this visit.   Subjective Assessment - 08/06/18 0847    Subjective  Reports a history of R knee and back pain, history of gout. 04/24/18 - steroid shot in knee. 05/11/18 - sharp increase in pain, inability to walk - spent 6 weeks in w/c due to pain. Has not received a diagnosis. Went to a specialist at [facility] - MRI - recommended pt to see neurologist. Received epidural injection on 06/29/18 - 3 days later started to have pain relief and ability to walk again. Has been trying to ride stationary bike and wlak on treadmill. Returns to work on Monday. Feels like he is still having some LBP as well as R knee pain - feels like R knee wants to give sometimes.     Pertinent History  gout, knee pain, recent use of w/c     How long can you sit comfortably?  no issues    How long can you stand comfortably?  20 minutes    How long can you walk comfortably?  <1 mile    Diagnostic tests  MRI: no significant findings per patient    Patient Stated Goals  "get back to working and exercising"    Currently in Pain?  No/denies    Multiple Pain Sites  No          [facility] PT Assessment - 08/06/18 0855      Assessment   Medical Diagnosis  B LE weakness    Referring Provider (PT)  Dr. Keyunna Coco Hunter    Onset Date/Surgical Date  --   04/2018   Next MD Visit  --   prn   Prior Therapy  no      Precautions   Precautions  Fall      Restrictions   Weight Bearing Restrictions  No      Balance Screen   Has the patient fallen in the past 6 months  No    Has the patient had a decrease in activity level because of a fear of falling?   No    Is the patient reluctant to leave their home because of a fear of falling?   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      Cognition   Overall Cognitive Status  Within Functional Limits for tasks assessed      Sensation   Light Touch  Impaired Detail    Light Touch Impaired Details  Impaired RLE    Additional Comments  Reports some N&T into R foot      Coordination   Gross Motor Movements are Fluid and Coordinated  Yes    Fine Motor Movements are Fluid and Coordinated  Yes      Functional Tests   Functional tests  Squat      Squat   Comments  forward lean with WB in toes; excessive trunk flexion      Posture/Postural Control   Posture/Postural Control  Postural limitations    Postural Limitations  Rounded Shoulders;Forward head;Weight shift right;Weight shift left      ROM / Strength   AROM / PROM / Strength  AROM;Strength      AROM   AROM Assessment Site  Lumbar    Lumbar Flexion  WNL - LE and back tightness    Lumbar Extension  50% limited - pain    Lumbar - Right Side Bend  75% limited and painful    Lumbar - Left Side Bend  25% limited    Lumbar - Right Rotation  WNL    Lumbar  - Left Rotation  WNL      Strength   Overall Strength Comments  L LE grossly 4/5, R LE grossly 4-/5      Flexibility   Soft Tissue Assessment /Muscle Length  yes    Hamstrings  B tightness    Quadriceps  B tightness (L 8in from buttock, R 12in from buttock)    Piriformis  L tightness      Palpation   Palpation comment  diffusely non-tender                Objective measurements completed on examination: See above findings.      [facility] Adult PT Treatment/Exercise - 08/06/18 0855      Exercises   Exercises  Knee/Hip      Knee/Hip Exercises: Stretches   Passive Hamstring Stretch  Right;Left;2 reps;30 seconds    Quad Stretch  Right;Left;2 reps;30 seconds    Piriformis Stretch  Left;2 reps;30 seconds      Knee/Hip Exercises: Standing   Hip Extension  Right;Left;10 reps;Knee straight    Functional Squat  10 reps      Knee/Hip Exercises: Supine   Bridges  10 reps    Straight Leg Raises  Right;Left;10 reps             PT Education - 08/06/18 1038    Education Details  exam findings, POC, goal establishment, HEP instruction    Person(s) Educated  Patient    Methods  Explanation;Demonstration;Handout    Comprehension  Verbalized understanding;Returned demonstration          PT Long Term Goals - 08/06/18 1045      PT LONG TERM GOAL #1   Title  patient to be independent with advanced HEP    Time  3    Period  Weeks    Status  New    Target Date  09/03/18      PT LONG TERM GOAL #2   Title  patient to improve lumbar AROM to WNL in all planes without  pain limiting    Time  3    Period  Weeks    Status  New    Target Date  09/03/18      PT LONG TERM GOAL #3   Title  patient to improve LE strength by 1/2 to 1 muscle grade demonstrating improved strength    Time  3    Period  Weeks    Status  New    Target Date  09/03/18      PT LONG TERM GOAL #4   Title  patient to report ability to return to full time work without pain limiting    Time  3     Period  Weeks    Status  New    Target Date  09/03/18             Plan - 08/06/18 1039    Clinical Impression Statement  [name] is a very pleasant 48 y/o male presenting to OPPT today for primary complaints of B LE weakness, following stent of severe back and R knee pain. Paitnet reporting recent 6 week history of inability to ambulate due to reduced tolerance to weight bearing, of which patient had to utilize w/c for primary means of mobility. Patient ambulating into clinic today and reports 0/10 pain, only "tightness." PT assessing low back mobility as well as B LE strength and flexibility with findings listed above. Patient instructed on HEP for LE flexibility and strengthening with good tolerance. Patient to benefit from skilled PT to address LE flexibility, reduced strenght, limited lumbar AROM to imporve QOL to allow for return to work and exercise.     Clinical Presentation  Stable    Clinical Decision Making  Low    Rehab Potential  Good    PT Frequency  2x / week    PT Duration  3 weeks    PT Treatment/Interventions  ADLs/Self Care Home Management;Cryotherapy;Electrical Stimulation;Functional mobility training;Stair training;Gait training;Ultrasound;Therapeutic activities;Moist Heat;Therapeutic exercise;Balance training;Neuromuscular re-education;Patient/family education;Passive range of motion;Manual techniques;Dry needling;Taping;Vasopneumatic Device    PT Next Visit Plan  progress HEP as tolerated    Consulted and Agree with Plan of Care  Patient       Patient will benefit from skilled therapeutic intervention in order to improve the following deficits and impairments:  Pain, Decreased activity tolerance, Decreased mobility, Decreased strength, Decreased range of motion, Impaired flexibility  Visit Diagnosis: Acute bilateral low back pain without sciatica  Muscle weakness (generalized)  Abnormal posture     Problem List There are no active problems to display for  this patient.    Kipp LaurenceStephanie R Dlisa Barnwell, PT, DPT Supplemental Physical Therapist 08/06/18 10:47 AM Pager: [phone] Office: [phone]  [facility] [address] [place], [place], [address] Phone: [phone]   Fax:  [phone]  Name: Cameron Hunter MRN: [id number] Date of Birth: 06/19/1970

## 2018-09-18 MED ORDER — MELOXICAM 15 MG PO TABS: 15.0000 mg | ORAL_TABLET | Freq: Every day | ORAL | 2 refills | Status: DC

## 2018-09-18 NOTE — Telephone Encounter (Signed)
Pt called in to schedule 37mo fu , offered pt April 22 at 3:30 pt stated he needed sooner he can hardly walk

## 2018-09-18 NOTE — Telephone Encounter (Signed)
I called and talk with the wife.  The patient is working as a Forensic psychologist, he is back to work but he has noted onset of some back pain and pain down into the right leg, underneath the right kneecap.  The prior epidural steroid injection was effective in controlling the pain, he remains on Lyrica and Mobic.  I will get another epidural injection set up.  Will need a revisit in about 3 months.

## 2018-09-18 NOTE — Addendum Note (Signed)
Addended by: York Spaniel on: 09/18/2018 12:58 PM   Modules accepted: Orders

## 2018-09-18 NOTE — Telephone Encounter (Signed)
I contacted the pt's wife and spoke with [place] ( ok per dpr). I advised the pt would be receiving a call to schedule the epidural injection the 3 month f.u would be to see Dr. Anne Hahn. Pt was under the impression he would be receiving a call for the epidural in 3 months.  Pt will call back to reschedule appt.

## 2018-09-18 NOTE — Telephone Encounter (Signed)
Pt states since going back to work on 08/11/18 his symptoms have flared back up again. He can barely walk when getting out of the bed due to the pain. He said [facility] has reviewed his records and advised they think it is coming from his back. He does not know who he spoke with. Please call to advise [phone]

## 2018-09-18 NOTE — Telephone Encounter (Signed)
I contacted the Cameron Hunter and left a vm requesting him to call back and schedule a 3 month f/u face to face visit with Dr. Anne Hahn.  When Cameron Hunter calls back please schedule.

## 2018-09-18 NOTE — Telephone Encounter (Signed)
Would you like me to offer a video visit for this pt?

## 2018-09-22 ENCOUNTER — Other Ambulatory Visit: Payer: Self-pay | Admitting: Neurology

## 2018-09-23 ENCOUNTER — Telehealth: Payer: Self-pay | Admitting: Neurology

## 2018-09-23 MED ORDER — PREDNISONE 10 MG PO TABS: ORAL_TABLET | ORAL | 0 refills | Status: DC

## 2018-09-23 MED ORDER — CYCLOBENZAPRINE HCL 5 MG PO TABS: 5.0000 mg | ORAL_TABLET | Freq: Three times a day (TID) | ORAL | 1 refills | Status: DC | PRN

## 2018-09-23 NOTE — Telephone Encounter (Signed)
Pt called stating that his back pain is worse and is wanting to know if there is something else that be given to him to hold him over till this COVID-19 blows over and he can come in to the office. Please advise.

## 2018-09-23 NOTE — Telephone Encounter (Signed)
I called the patient.  His back pain is gotten worse, he is having some pain down the leg to the knee on the right.  He has not yet had the epidural injection, they are holding off because of the bilateral pan epidemic.  I will give him a 12-day course of prednisone, I will give him Flexeril to take.

## 2018-11-12 ENCOUNTER — Other Ambulatory Visit: Payer: Self-pay | Admitting: Neurology

## 2018-12-16 ENCOUNTER — Other Ambulatory Visit: Payer: Self-pay | Admitting: Neurology

## 2019-01-12 ENCOUNTER — Other Ambulatory Visit: Payer: Self-pay | Admitting: Neurology

## 2019-02-06 ENCOUNTER — Other Ambulatory Visit: Payer: Self-pay | Admitting: Neurology

## 2019-04-06 ENCOUNTER — Other Ambulatory Visit: Payer: Self-pay | Admitting: Neurology

## 2019-06-26 ENCOUNTER — Ambulatory Visit
Admission: EM | Admit: 2019-06-26 | Discharge: 2019-06-26 | Disposition: A | Discharge status: Home / Self Care | Payer: 59

## 2019-06-26 ENCOUNTER — Encounter: Payer: Self-pay | Admitting: Physician Assistant

## 2019-06-26 ENCOUNTER — Other Ambulatory Visit: Payer: Self-pay

## 2019-06-26 ENCOUNTER — Telehealth: Payer: Self-pay

## 2019-06-26 DIAGNOSIS — M25511 Pain in right shoulder: Secondary | ICD-10-CM

## 2019-06-26 MED ORDER — KETOROLAC TROMETHAMINE 30 MG/ML IJ SOLN
30.0000 mg | Freq: Once | INTRAMUSCULAR | Status: AC
  Administered 2019-06-26: 30 mg via INTRAMUSCULAR

## 2019-06-26 MED ORDER — PREDNISONE 50 MG PO TABS: 50.0000 mg | ORAL_TABLET | Freq: Every day | ORAL | 0 refills | Status: DC

## 2019-06-26 NOTE — ED Provider Notes (Signed)
[facility]    CSN: [id number] Arrival date & time: 06/26/19  1438      History   Chief Complaint Chief Complaint  Patient presents with  . Shoulder Pain    HPI Cameron Hunter is a 49 y.o. male.   49 year old male comes in for 2-day history of right shoulder/neck pain.  Denies injury/trauma.  States was doing yard work yesterday prior to symptoms starting.  Denies any joint swelling, erythema, warmth, rash.  Denies radiation of pain, numbness, tingling.  Denies loss of grip strength. Takes mobic and lyrica daily.   Patient hypertensive at 235/138. Denies chest pain, shortness of breath, palpitation, syncope.  Takes lisinopril daily, denies missing doses.  States checks blood pressure at home, ranging around 110s to 120s systolic.     Past Medical History:  Diagnosis Date  . Gout   . Knee pain    Right knee     There are no problems to display for this patient.   History reviewed. No pertinent surgical history.     Home Medications    Prior to Admission medications   Medication Sig Start Date End Date Taking? Authorizing Provider  lisinopril (ZESTRIL) 20 MG tablet Take 20 mg by mouth daily.   Yes [provider]  Acetaminophen (TYLENOL PO) Take by mouth.    [provider]  cyclobenzaprine (FLEXERIL) 5 MG tablet TAKE 1 TABLET BY MOUTH EVERY 8 HOURS AS NEEDED FOR MUSCLE SPASM 02/09/19   York Spaniel, MD  meloxicam (MOBIC) 15 MG tablet TAKE 1 TABLET BY MOUTH ONCE DAILY 02/09/19   York Spaniel, MD  predniSONE (DELTASONE) 50 MG tablet Take 1 tablet (50 mg total) by mouth daily with breakfast. 06/26/19   Cathie Hoops, Jarquis Walker V, PA-C  pregabalin (LYRICA) 50 MG capsule TAKE 2 CAPSULES BY MOUTH TWICE A DAY 04/06/19   York Spaniel, MD  traZODone (DESYREL) 100 MG tablet Take 100 mg by mouth at bedtime. 1 at bedtime    [provider]    Family History Family History  Problem Relation Age of Onset  . Lung cancer Mother   . Diabetes  Mother   . COPD Mother   . Heart disease Mother   . Kidney disease Mother     Social History Social History   Tobacco Use  . Smoking status: Never Smoker  . Smokeless tobacco: Never Used  Substance Use Topics  . Alcohol use: Not Currently  . Drug use: Never     Allergies   Patient has no known allergies.   Review of Systems Review of Systems  Reason unable to perform ROS: See HPI as above.     Physical Exam Triage Vital Signs ED Triage Vitals [06/26/19 1450]  Enc Vitals Group     BP (!) 235/138     Pulse Rate 89     Resp 16     Temp 98.1 F (36.7 C)     Temp Source Oral     SpO2 97 %     Weight      Height      Head Circumference      Peak Flow      Pain Score      Pain Loc      Pain Edu?      Excl. in GC?    No data found.  Updated Vital Signs BP (!) 168/105 (BP Location: Left Arm)   Pulse 89   Temp 98.1 F (36.7 C) (Oral)  Resp 16   SpO2 97%   Physical Exam Constitutional:      General: He is not in acute distress.    Appearance: He is well-developed. He is not diaphoretic.  HENT:     Head: Normocephalic and atraumatic.  Eyes:     Conjunctiva/sclera: Conjunctivae normal.     Pupils: Pupils are equal, round, and reactive to light.  Cardiovascular:     Rate and Rhythm: Normal rate and regular rhythm.     Heart sounds: Normal heart sounds.  Pulmonary:     Effort: Pulmonary effort is normal. No respiratory distress.     Comments: LCTAB Musculoskeletal:     Comments: No swelling, erythema, warmth, contusion.  No tenderness to palpation of spinous processes.  Diffuse tenderness to palpation of right neck, middle trapezius.  No bony tenderness of the shoulder.  Tenderness to palpation of right anterior shoulder, along bicep tendon insertion.  Full range of motion of neck, shoulder, elbow.  Strength normal and equal bilaterally.  Normal grip strength.  Sensation intact and equal bilaterally.  Radial pulse 2+, cap refill less than 2 seconds.   Skin:    General: Skin is warm and dry.  Neurological:     Mental Status: He is alert and oriented to person, place, and time.      UC Treatments / Results  Labs (all labs ordered are listed, but only abnormal results are displayed) Labs Reviewed - No data to display  EKG   Radiology No results found.  Procedures Procedures (including critical care time)  Medications Ordered in UC Medications  ketorolac (TORADOL) 30 MG/ML injection 30 mg (30 mg Intramuscular Given 06/26/19 1524)    Initial Impression / Assessment and Plan / UC Course  I have reviewed the triage vital signs and the nursing notes.  Pertinent labs & imaging results that were available during my care of the patient were reviewed by me and considered in my medical decision making (see chart for details).    Toradol injection in office today. Given already on mobic, will provide prednisone as directed. Can continue flexeril as needed. Return precautions given.  Patient with significant improvement of BP after rest.  Will have patient continue to monitor at home, and follow-up with PCP for management if needed.  Final Clinical Impressions(s) / UC Diagnoses   Final diagnoses:  Acute pain of right shoulder   ED Prescriptions    Medication Sig Dispense Auth. Provider   predniSONE (DELTASONE) 50 MG tablet Take 1 tablet (50 mg total) by mouth daily with breakfast. 5 tablet Ok Edwards, PA-C     I have reviewed the PDMP during this encounter.   Ok Edwards, PA-C 06/26/19 1744

## 2019-06-26 NOTE — Discharge Instructions (Addendum)
Toradol injection in office today. Start prednisone as directed. Ice/warm compress as needed. Follow up with PCP if symptoms not improving.   Blood pressure much improved. Continue to check at home, if still elevated, please document daily BP readings and follow up with PCP for management needed.

## 2019-06-26 NOTE — ED Triage Notes (Signed)
Pt c/o rt shoulder pain/neck pain after working in the yard 3 days ago, denies injury

## 2019-07-02 ENCOUNTER — Telehealth: Payer: Self-pay | Admitting: Neurology

## 2019-07-02 MED ORDER — MELOXICAM 15 MG PO TABS: 15.0000 mg | ORAL_TABLET | Freq: Every day | ORAL | 1 refills | Status: DC

## 2019-07-02 MED ORDER — CYCLOBENZAPRINE HCL 5 MG PO TABS: ORAL_TABLET | ORAL | 1 refills | Status: DC

## 2019-07-02 NOTE — Addendum Note (Signed)
Addended by: Lindell Spar C on: 07/02/2019 04:17 PM   Modules accepted: Orders

## 2019-07-02 NOTE — Telephone Encounter (Signed)
1) Medication(s) Requested (by name):   Cyclobenzaprine HCl 5 MG TAKE 1 TABLET BY MOUTH EVERY 8 HOURS AS NEEDED FOR MUSCLE SPASM   Meloxicam 15 MG TAKE 1 TABLET BY MOUTH ONCE DAILY  2) Pharmacy of Choice: [facility] - [place], [place] - [address]  [address] [place] [address]   3) Special Requests:   Patient was scheduled FU apt for march 25th

## 2019-07-25 ENCOUNTER — Other Ambulatory Visit: Payer: Self-pay | Admitting: Family Medicine

## 2019-07-25 DIAGNOSIS — M25511 Pain in right shoulder: Secondary | ICD-10-CM

## 2019-08-12 ENCOUNTER — Other Ambulatory Visit: Payer: Self-pay

## 2019-08-12 ENCOUNTER — Ambulatory Visit
Admission: RE | Admit: 2019-08-12 | Discharge: 2019-08-12 | Disposition: A | Discharge status: Home / Self Care | Payer: 59 | Source: Ambulatory Visit | Attending: Family Medicine | Admitting: Family Medicine

## 2019-08-12 DIAGNOSIS — M25511 Pain in right shoulder: Secondary | ICD-10-CM

## 2019-08-31 ENCOUNTER — Other Ambulatory Visit: Payer: Self-pay | Admitting: Neurology

## 2019-09-09 NOTE — Progress Notes (Signed)
PATIENT: Cameron Hunter DOB: 1971-03-24  REASON FOR VISIT: follow up HISTORY FROM: patient  HISTORY OF PRESENT ILLNESS: Today 09/10/19  Cameron Hunter is a 49 year old male with history of right knee pain since June 2019. He was sent for a ESI of the low back with good benefit in January 2020.  He was sent for physical therapy, he went 1 time, got some home exercises to do.  MRI of the lumbar spine showed no evidence of significant spinal stenosis, the written impression suggest possible impingement of the L3 nerve roots.  He remains on meloxicam, Lyrica, and Flexeril.  He indicates he no longer has any pain to his right knee.  Most of the pain was located in the right knee, very minimal in the low back.  He is now working, he is a Production designer, theatre/television/film at [employer], doing well.  He has had some issues with his right shoulder, reports he had blood work done in January, including kidney and liver function that was normal.  He is always worried that his knee pain is going to come back.  He presents today for follow-up unaccompanied.  HISTORY  06/20/2018 Dr. Anne Hahn: Cameron Hunter is a 49 year old right-handed white male with a history of onset of right knee pain that began in June 2019.  The patient does have a prior history of gout, initially the patient had a swollen hot right knee joint that seemed to respond to prednisone and indomethacin.  The patient felt better until August 2019 and he then had a second bout of right knee pain again with some warmth and swelling.  The patient was again treated with a cortisone injection which seemed to help.  He did well until 10 April 2018.  The patient began having severe pain at that point in the right knee, he had MRI of the knee but was told he had mild arthritic changes, nothing that would require surgery.  The patient had worsening the pain by 7 November and once again by 24 November the pain worsened.  The patient went to the emergency room on 27 November and received IV  fluids and Dilaudid without much benefit.  The patient was placed on Lyrica by 27 May 2018.  He had MRI of the lumbar spine that did show evidence of mild to moderate spinal stenosis at the L2-3 level with possible impingement of the L3 and L4 nerve roots bilaterally.  The patient has gained benefit from the Lyrica after about 3 weeks of treatment.  He is feeling better with his right knee but he is still not ambulatory.  The patient comes in today in a wheelchair.  He denies numbness of the extremities, he denies neck pain or pain down the arms.  The patient denies any back pain whatsoever.  He has some achiness at times into the lower thigh on the right but he denies pain going from the back down the leg to the foot.  He reports no definite weakness of the extremities.  The patient is sent to this office for further evaluation.  The pain in the right knee is present with moving the knee and with weightbearing.  REVIEW OF SYSTEMS: Out of a complete 14 system review of symptoms, the patient complains only of the following symptoms, and all other reviewed systems are negative.  Right knee pain  ALLERGIES: No Known Allergies  HOME MEDICATIONS: Outpatient Medications Prior to Visit  Medication Sig Dispense Refill  . Acetaminophen (TYLENOL PO) Take by mouth.    Marland Kitchen  cyclobenzaprine (FLEXERIL) 5 MG tablet TAKE 1 TABLET BY MOUTH EVERY 8 HOURS AS NEEDED FOR MUSCLE SPASM 60 tablet 1  . lisinopril (ZESTRIL) 20 MG tablet Take 20 mg by mouth daily.    . meloxicam (MOBIC) 15 MG tablet TAKE 1 TABLET BY MOUTH DAILY. FOLLOW UP ON 09/10/19 30 tablet 1  . pregabalin (LYRICA) 50 MG capsule TAKE 2 CAPSULES BY MOUTH TWICE A DAY 360 capsule 1  . predniSONE (DELTASONE) 50 MG tablet Take 1 tablet (50 mg total) by mouth daily with breakfast. (Patient not taking: Reported on 09/10/2019) 5 tablet 0  . traZODone (DESYREL) 100 MG tablet Take 100 mg by mouth at bedtime. 1 at bedtime     No facility-administered medications  prior to visit.    PAST MEDICAL HISTORY: Past Medical History:  Diagnosis Date  . Gout   . Knee pain    Right knee     PAST SURGICAL HISTORY: No past surgical history on file.  FAMILY HISTORY: Family History  Problem Relation Age of Onset  . Lung cancer Mother   . Diabetes Mother   . COPD Mother   . Heart disease Mother   . Kidney disease Mother     SOCIAL HISTORY: Social History   Socioeconomic History  . Marital status: Married    Spouse name: Not on file  . Number of children: Not on file  . Years of education: Not on file  . Highest education level: Not on file  Occupational History  . Not on file  Tobacco Use  . Smoking status: Never Smoker  . Smokeless tobacco: Never Used  Substance and Sexual Activity  . Alcohol use: Not Currently  . Drug use: Never  . Sexual activity: Not on file  Other Topics Concern  . Not on file  Social History Narrative   Right handed    Caffeine occasional     Lives with wife at home    Social Determinants of Health   Financial Resource Strain:   . Difficulty of Paying Living Expenses:   Food Insecurity:   . Worried About Programme researcher, broadcasting/film/video in the Last Year:   . Barista in the Last Year:   Transportation Needs:   . Freight forwarder (Medical):   Marland Kitchen Lack of Transportation (Non-Medical):   Physical Activity:   . Days of Exercise per Week:   . Minutes of Exercise per Session:   Stress:   . Feeling of Stress :   Social Connections:   . Frequency of Communication with Friends and Family:   . Frequency of Social Gatherings with Friends and Family:   . Attends Religious Services:   . Active Member of Clubs or Organizations:   . Attends Banker Meetings:   Marland Kitchen Marital Status:   Intimate Partner Violence:   . Fear of Current or Ex-Partner:   . Emotionally Abused:   Marland Kitchen Physically Abused:   . Sexually Abused:    PHYSICAL EXAM  Vitals:   09/10/19 0845  BP: (!) 182/94  Pulse: 86  Temp: (!) 97.1  F (36.2 C)  Weight: (!) 320 lb (145.2 kg)  Height: 6\' 2"  (1.88 m)   Body mass index is 41.09 kg/m.  Generalized: Well developed, in no acute distress   Neurological examination  Mentation: Alert oriented to time, place, history taking. Follows all commands speech and language fluent Cranial nerve II-XII: Pupils were equal round reactive to light. Extraocular movements were full, visual field were  full on confrontational test. Facial sensation and strength were normal. Head turning and shoulder shrug  were normal and symmetric. Motor: The motor testing reveals 5 over 5 strength of all 4 extremities. Good symmetric motor tone is noted throughout.  Sensory: Sensory testing is intact to soft touch on all 4 extremities. No evidence of extinction is noted.  Coordination: Cerebellar testing reveals good finger-nose-finger and heel-to-shin bilaterally.  Gait and station: Gait is normal. Tandem gait is normal. Romberg is negative. No drift is seen.  Reflexes: Deep tendon reflexes are symmetric, but depressed right patella wearing knee sleeve  DIAGNOSTIC DATA (LABS, IMAGING, TESTING) - I reviewed patient records, labs, notes, testing and imaging myself where available.  No results found for: WBC, HGB, HCT, MCV, PLT    Component Value Date/Time   NA 142 06/20/2018 0854   K 4.1 06/20/2018 0854   CL 97 06/20/2018 0854   CO2 25 06/20/2018 0854   GLUCOSE 103 (H) 06/20/2018 0854   BUN 9 06/20/2018 0854   CREATININE 0.85 06/20/2018 0854   CALCIUM 9.4 06/20/2018 0854   PROT 6.4 06/20/2018 0854   ALBUMIN 4.2 06/20/2018 0854   AST 19 06/20/2018 0854   ALT 27 06/20/2018 0854   ALKPHOS 57 06/20/2018 0854   BILITOT 0.4 06/20/2018 0854   GFRNONAA 102 06/20/2018 0854   GFRAA 118 06/20/2018 0854   No results found for: CHOL, HDL, LDLCALC, LDLDIRECT, TRIG, CHOLHDL No results found for: HGBA1C No results found for: VITAMINB12 No results found for: TSH    ASSESSMENT AND PLAN 49 y.o. year old  male  has a past medical history of Gout and Knee pain. here with:  1.  Right knee discomfort  He has continued to do well since last seen.  He apparently greatly benefited from an [facility] to his lumbar spine in January 2020.  He is hesitant to come off any of his medications, as he is fearful the significant pain and gait problems will return.  He will remain on Mobic 15 mg daily, Lyrica 100 mg twice a day, and Flexeril 5 mg daily.  He does not wish to decrease the dose on any of these.  He reports recent blood work that showed normal kidney and liver.  He will follow-up in 6 months, if he continues to do well, will continue follow-up with his PCP.  He should see his PCP for routine physical, BP was elevated today.  I spent 20 minutes of face-to-face and non-face-to-face time with patient.  This included previsit chart review, lab review, study review, order entry, electronic health record documentation, patient education.   Butler Denmark, AGNP-C, DNP 09/10/2019, 8:51 AM [facility] [address] [place], [place] [address] [phone]

## 2019-09-10 ENCOUNTER — Encounter: Payer: Self-pay | Admitting: Neurology

## 2019-09-10 ENCOUNTER — Other Ambulatory Visit: Payer: Self-pay

## 2019-09-10 ENCOUNTER — Ambulatory Visit: Payer: 59 | Admitting: Neurology

## 2019-09-10 DIAGNOSIS — M25569 Pain in unspecified knee: Secondary | ICD-10-CM | POA: Insufficient documentation

## 2019-09-10 DIAGNOSIS — G8929 Other chronic pain: Secondary | ICD-10-CM | POA: Diagnosis not present

## 2019-09-10 DIAGNOSIS — M25561 Pain in right knee: Secondary | ICD-10-CM | POA: Diagnosis not present

## 2019-09-10 MED ORDER — PREGABALIN 50 MG PO CAPS: 100.0000 mg | ORAL_CAPSULE | Freq: Two times a day (BID) | ORAL | 1 refills | Status: DC

## 2019-09-10 MED ORDER — MELOXICAM 15 MG PO TABS: ORAL_TABLET | ORAL | 5 refills | Status: DC

## 2019-09-10 MED ORDER — CYCLOBENZAPRINE HCL 5 MG PO TABS: ORAL_TABLET | ORAL | 1 refills | Status: DC

## 2019-09-10 NOTE — Progress Notes (Signed)
I have read the note, and I agree with the clinical assessment and plan.  Cameron Hunter K Cameron Hunter   

## 2019-09-10 NOTE — Patient Instructions (Signed)
Continue on current medications  See you in 6 months

## 2019-11-03 IMAGING — XA Imaging study
2 series · 2 of 2 positions shown · non-contrast
Comparison: none

CLINICAL DATA: Lumbosacral spondylosis without myelopathy. Right
knee pain with some pain in the anterior right thigh and low back.
Multilevel disc displacement in the lumbar spine including at L2-3
where a broad posterior protrusion results in spinal and bilateral
lateral recess stenosis. Transitional lumbosacral anatomy with
partially sacralized L5.

[Series 1: ortho standard · 1 of 1 slices shown (1 of 2)]
[im 1/1]
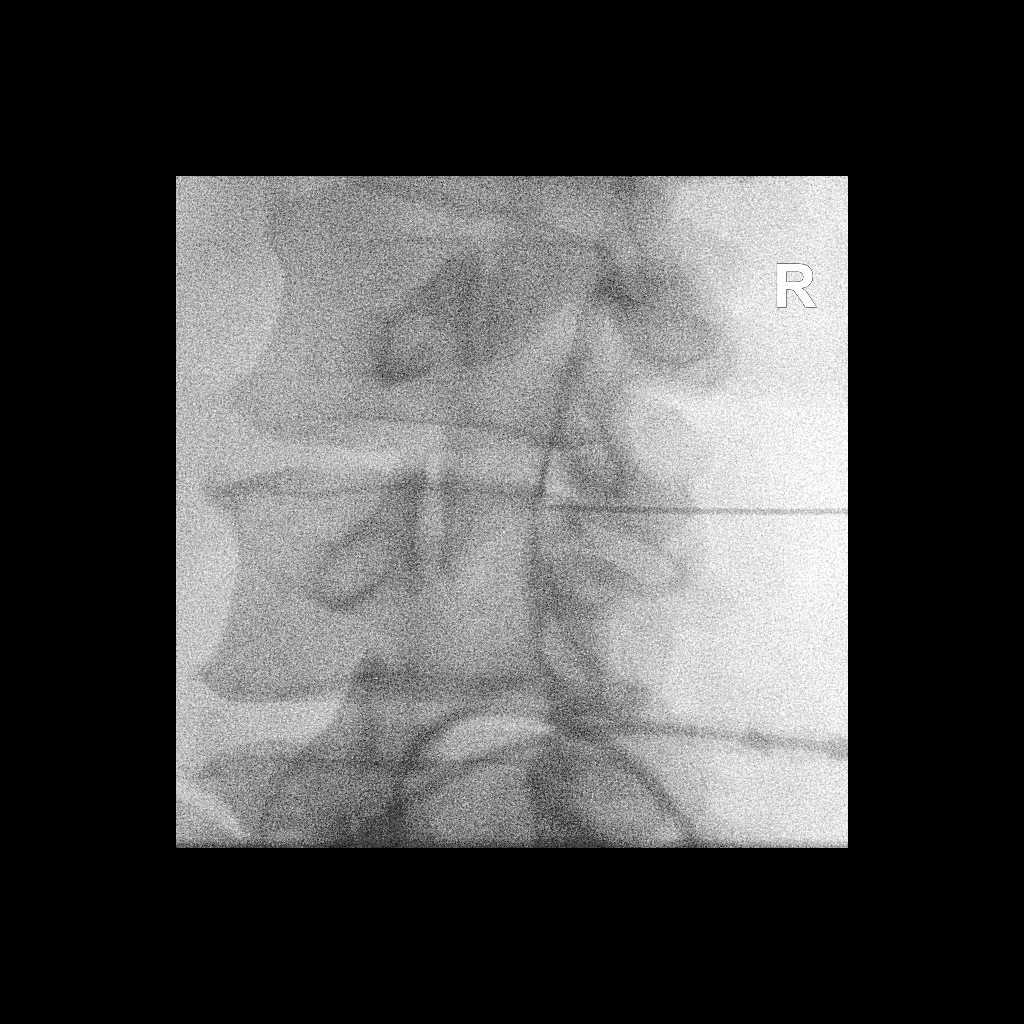

[Series 2: ortho standard · 1 of 1 slices shown (2 of 2)]
[im 1/1]
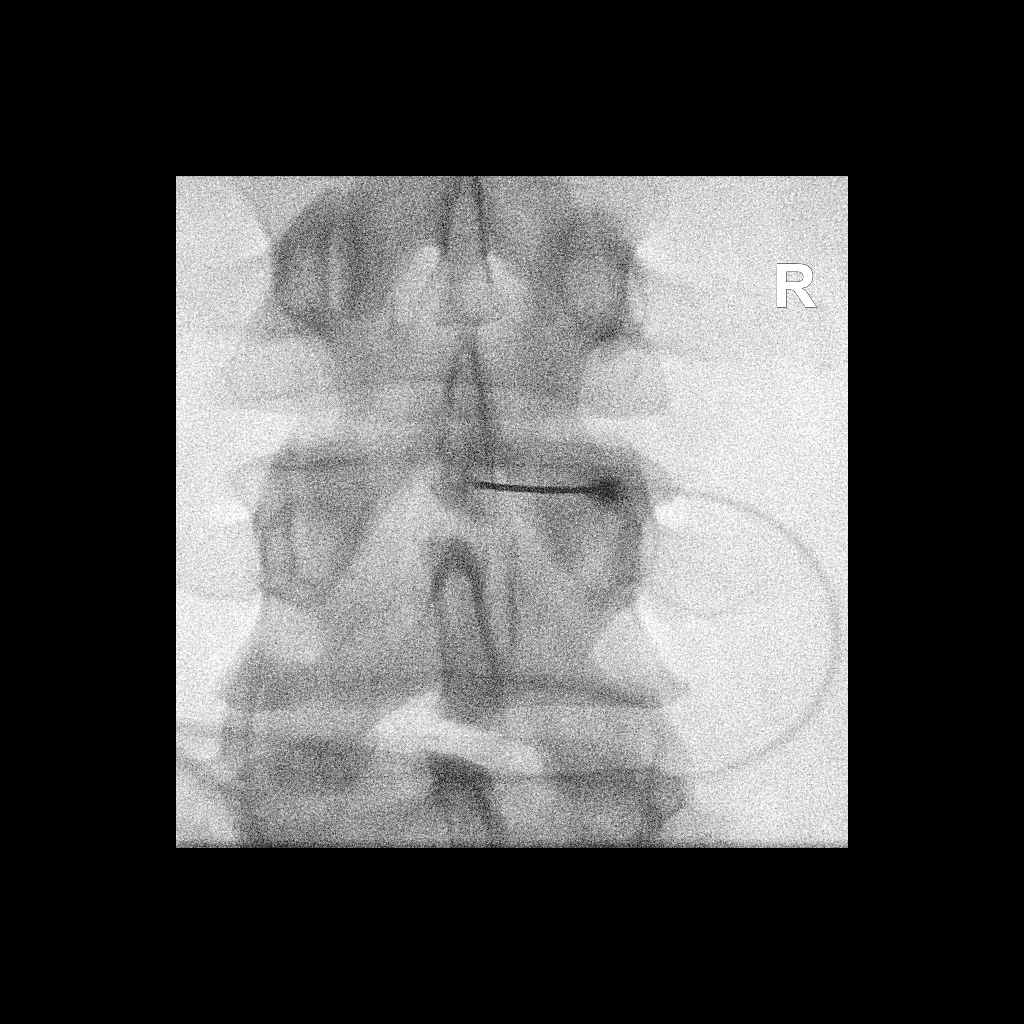

[2 of 2 positions shown; findings below may reference images not displayed]

FLUOROSCOPY TIME:  Radiation Exposure Index (as provided by the
fluoroscopic device): 26.28 microGray*m^2

Fluoroscopy Time (in minutes and seconds):  9 seconds

PROCEDURE:
The procedure, risks, benefits, and alternatives were explained to
the patient. Questions regarding the procedure were encouraged and
answered. The patient understands and consents to the procedure.

LUMBAR EPIDURAL INJECTION:

An interlaminar approach was performed on the right at L2-3. The
overlying skin was cleansed and anesthetized. A 3.5 inch 20 gauge
epidural needle was advanced using loss-of-resistance technique.

DIAGNOSTIC EPIDURAL INJECTION:

Injection of Isovue-M 200 shows a good epidural pattern with spread
above and below the level of needle placement, primarily on the
right. No vascular opacification is seen.

THERAPEUTIC EPIDURAL INJECTION:

120 mg of Depo-Medrol mixed with 3 mL of 1% lidocaine were
instilled. The procedure was well-tolerated, and the patient was
discharged thirty minutes following the injection in good condition.

COMPLICATIONS:
None
IMPRESSION: Technically successful lumbar interlaminar epidural injection on the
right at L2-3.

## 2020-01-14 ENCOUNTER — Other Ambulatory Visit: Payer: Self-pay | Admitting: Neurology

## 2020-01-14 MED ORDER — CYCLOBENZAPRINE HCL 5 MG PO TABS: ORAL_TABLET | ORAL | 1 refills | Status: DC

## 2020-01-14 NOTE — Addendum Note (Signed)
Addended by: [place] Pink on: 01/14/2020 04:35 PM   Modules accepted: Orders

## 2020-01-14 NOTE — Telephone Encounter (Signed)
Pt is requesting a refill for cyclobenzaprine (FLEXERIL) 5 MG tablet .  Pharmacy: [facility]

## 2020-01-21 ENCOUNTER — Other Ambulatory Visit: Payer: Self-pay | Admitting: Neurology

## 2020-01-21 MED ORDER — MELOXICAM 15 MG PO TABS: ORAL_TABLET | ORAL | 5 refills | Status: DC

## 2020-01-21 MED ORDER — PREGABALIN 50 MG PO CAPS: 100.0000 mg | ORAL_CAPSULE | Freq: Two times a day (BID) | ORAL | 1 refills | Status: DC

## 2020-01-21 NOTE — Telephone Encounter (Signed)
[place] is leaving for [place] tomm[place]ow and needs refills on medication. Best call back number [phone]

## 2020-02-01 ENCOUNTER — Other Ambulatory Visit: Payer: Self-pay | Admitting: Neurology

## 2020-02-01 MED ORDER — CYCLOBENZAPRINE HCL 5 MG PO TABS: 5.0000 mg | ORAL_TABLET | Freq: Two times a day (BID) | ORAL | 5 refills | Status: DC | PRN

## 2020-02-01 NOTE — Telephone Encounter (Signed)
Pt request refill cyclobenzaprine (FLEXERIL) 5 MG tablet at [facility]. Pt ask if dosage can return original dosage twice a day instead of once a day. Pt would like a phone call from the nurse.

## 2020-02-01 NOTE — Telephone Encounter (Signed)
I called pt and he stated that he thought that after last visit things would stay the same.  Has had been taking flexeril 5mg  po bid.  Some how the prescription was written for daily. He would like to go back to original dosing.

## 2020-02-01 NOTE — Addendum Note (Signed)
Addended by: Guy Begin on: 02/01/2020 11:33 AM   Modules accepted: Orders

## 2020-03-15 ENCOUNTER — Ambulatory Visit: Payer: 59 | Admitting: Neurology

## 2020-03-15 ENCOUNTER — Encounter: Payer: Self-pay | Admitting: Neurology

## 2020-03-15 VITALS — BP 140/88 | HR 82 | Ht 74.0 in | Wt 292.8 lb

## 2020-03-15 DIAGNOSIS — M25561 Pain in right knee: Secondary | ICD-10-CM | POA: Diagnosis not present

## 2020-03-15 DIAGNOSIS — G8929 Other chronic pain: Secondary | ICD-10-CM | POA: Diagnosis not present

## 2020-03-15 MED ORDER — PREGABALIN 50 MG PO CAPS: 100.0000 mg | ORAL_CAPSULE | Freq: Two times a day (BID) | ORAL | 1 refills | Status: DC

## 2020-03-15 NOTE — Progress Notes (Signed)
PATIENT: Cameron Hunter DOB: 06/07/1971  REASON FOR VISIT: follow up HISTORY FROM: patient  HISTORY OF PRESENT ILLNESS: Today 03/15/20  Cameron Hunter is a 49 year old male with history of right knee pain since June 2019.  He had good benefit from [facility] of the lumbar spine in January 2020.  His right knee pain has improved, very minimal in the low back.  He is on meloxicam, Lyrica, and Flexeril.  Is overall doing well, may have some burning to the right knee, more than anything he is fearful the intense pain will come back, there was a time he was in a w/c.  Has seen orthopedics previously, no structural issue identified to the knee.  He has lost 30 pounds, is eating better, exercising.  He wears a right knee sleeve. Unsure if some of his symptoms could be in his mind?  He works as a Social research officer, government at [employer]. He has been to [employer], and walking a lot without problem.  Is hesitant to reduce the dose of his medicines, has tried less Flexeril, felt more pain possibly?  Presents today for evaluation unaccompanied.  HISTORY 09/10/2019 SS: Cameron Hunter is a 49 year old male with history of right knee pain since June 2019. He was sent for a ESI of the low back with good benefit in January 2020.  He was sent for physical therapy, he went 1 time, got some home exercises to do.  MRI of the lumbar spine showed no evidence of significant spinal stenosis, the written impression suggest possible impingement of the L3 nerve roots.  He remains on meloxicam, Lyrica, and Flexeril.  He indicates he no longer has any pain to his right knee.  Most of the pain was located in the right knee, very minimal in the low back.  He is now working, he is a Production designer, theatre/television/film at [employer], doing well.  He has had some issues with his right shoulder, reports he had blood work done in January, including kidney and liver function that was normal.  He is always worried that his knee pain is going to come back.  He presents today for follow-up  unaccompanied.   REVIEW OF SYSTEMS: Out of a complete 14 system review of symptoms, the patient complains only of the following symptoms, and all other reviewed systems are negative.  Knee pain  ALLERGIES: No Known Allergies  HOME MEDICATIONS: Outpatient Medications Prior to Visit  Medication Sig Dispense Refill  . Acetaminophen (TYLENOL PO) Take by mouth.    . cyclobenzaprine (FLEXERIL) 5 MG tablet Take 1 tablet (5 mg total) by mouth 2 (two) times daily as needed for muscle spasms. 60 tablet 5  . indomethacin (INDOCIN) 50 MG capsule Take by mouth 3 (three) times daily as needed.    Marland Kitchen lisinopril (ZESTRIL) 30 MG tablet Take 30 mg by mouth daily.    . meloxicam (MOBIC) 15 MG tablet TAKE 1 TABLET BY MOUTH DAILY. FOLLOW UP ON 09/10/19 30 tablet 5  . traZODone (DESYREL) 100 MG tablet Take 100 mg by mouth at bedtime. 1 at bedtime    . pregabalin (LYRICA) 50 MG capsule Take 2 capsules (100 mg total) by mouth 2 (two) times daily. 360 capsule 1  . lisinopril (ZESTRIL) 20 MG tablet Take 20 mg by mouth daily.    . predniSONE (DELTASONE) 50 MG tablet Take 1 tablet (50 mg total) by mouth daily with breakfast. (Patient not taking: Reported on 09/10/2019) 5 tablet 0   No facility-administered medications prior to visit.  PAST MEDICAL HISTORY: Past Medical History:  Diagnosis Date  . Gout   . Knee pain    Right knee     PAST SURGICAL HISTORY: No past surgical history on file.  FAMILY HISTORY: Family History  Problem Relation Age of Onset  . Lung cancer Mother   . Diabetes Mother   . COPD Mother   . Heart disease Mother   . Kidney disease Mother     SOCIAL HISTORY: Social History   Socioeconomic History  . Marital status: Married    Spouse name: Not on file  . Number of children: Not on file  . Years of education: Not on file  . Highest education level: Not on file  Occupational History  . Not on file  Tobacco Use  . Smoking status: Never Smoker  . Smokeless tobacco: Never  Used  Vaping Use  . Vaping Use: Never used  Substance and Sexual Activity  . Alcohol use: Not Currently  . Drug use: Never  . Sexual activity: Not on file  Other Topics Concern  . Not on file  Social History Narrative   Right handed    Caffeine occasional     Lives with wife at home    Social Determinants of Health   Financial Resource Strain:   . Difficulty of Paying Living Expenses: Not on file  Food Insecurity:   . Worried About Programme researcher, broadcasting/film/video in the Last Year: Not on file  . Ran Out of Food in the Last Year: Not on file  Transportation Needs:   . Lack of Transportation (Medical): Not on file  . Lack of Transportation (Non-Medical): Not on file  Physical Activity:   . Days of Exercise per Week: Not on file  . Minutes of Exercise per Session: Not on file  Stress:   . Feeling of Stress : Not on file  Social Connections:   . Frequency of Communication with Friends and Family: Not on file  . Frequency of Social Gatherings with Friends and Family: Not on file  . Attends Religious Services: Not on file  . Active Member of Clubs or Organizations: Not on file  . Attends Banker Meetings: Not on file  . Marital Status: Not on file  Intimate Partner Violence:   . Fear of Current or Ex-Partner: Not on file  . Emotionally Abused: Not on file  . Physically Abused: Not on file  . Sexually Abused: Not on file   PHYSICAL EXAM  Vitals:   03/15/20 0815  BP: 140/88  Pulse: 82  Weight: 292 lb 12.8 oz (132.8 kg)  Height: 6\' 2"  (1.88 m)   Body mass index is 37.59 kg/m.  Generalized: Well developed, in no acute distress   Neurological examination  Mentation: Alert oriented to time, place, history taking. Follows all commands speech and language fluent Cranial nerve II-XII: Pupils were equal round reactive to light. Extraocular movements were full, visual field were full on confrontational test. Facial sensation and strength were normal. Head turning and  shoulder shrug  were normal and symmetric. Motor: The motor testing reveals 5 over 5 strength of all 4 extremities. Good symmetric motor tone is noted throughout.  Sensory: Sensory testing is intact to soft touch on all 4 extremities. No evidence of extinction is noted.  Coordination: Cerebellar testing reveals good finger-nose-finger and heel-to-shin bilaterally.  Gait and station: Gait is normal.  Reflexes: Deep tendon reflexes are symmetric and normal bilaterally.   DIAGNOSTIC DATA (LABS, IMAGING, TESTING) -  I reviewed patient records, labs, notes, testing and imaging myself where available.  No results found for: WBC, HGB, HCT, MCV, PLT    Component Value Date/Time   NA 142 06/20/2018 0854   K 4.1 06/20/2018 0854   CL 97 06/20/2018 0854   CO2 25 06/20/2018 0854   GLUCOSE 103 (H) 06/20/2018 0854   BUN 9 06/20/2018 0854   CREATININE 0.85 06/20/2018 0854   CALCIUM 9.4 06/20/2018 0854   PROT 6.4 06/20/2018 0854   ALBUMIN 4.2 06/20/2018 0854   AST 19 06/20/2018 0854   ALT 27 06/20/2018 0854   ALKPHOS 57 06/20/2018 0854   BILITOT 0.4 06/20/2018 0854   GFRNONAA 102 06/20/2018 0854   GFRAA 118 06/20/2018 0854   No results found for: CHOL, HDL, LDLCALC, LDLDIRECT, TRIG, CHOLHDL No results found for: GEXB2W No results found for: VITAMINB12 No results found for: TSH  ASSESSMENT AND PLAN 49 y.o. year old male  has a past medical history of Gout and Knee pain. here with:  1.  Right knee discomfort -Continues to do well, since [facility] lumbar spine in January 2020 -Hesitant to reduce the dose of any medications -Continue Lyrica 100 mg twice a day (refill sent last fill 03/01/2020 # 120 last refill) -Continue Flexeril 5 mg twice a day -Continue meloxicam 15 mg daily -Continue follow-up with PCP, follow-up here as needed -Previous MRI of lumbar spine written report suggest possible impingement of L3 nerve roots, structure of the low back appears relatively unremarkable, if pain returns may  consider NCV/EMG, we talked about this, if symptoms return will call our office   I spent 30 minutes of face-to-face and non-face-to-face time with patient.  This included previsit chart review, lab review, study review, order entry, electronic health record documentation, patient education.  Margie Ege, AGNP-C, DNP 03/15/2020, 8:48 AM [facility] [address], [address] [place], [place] [address] [phone]

## 2020-03-15 NOTE — Progress Notes (Signed)
I have read the note, and I agree with the clinical assessment and plan.  TRUE Garciamartinez K Purnell Daigle   

## 2020-03-15 NOTE — Patient Instructions (Signed)
Great to see you today! We can continue current medications Continue follow-up with your primary doctor  Call for any problems or concerns!

## 2020-07-27 ENCOUNTER — Other Ambulatory Visit: Payer: Self-pay | Admitting: Neurology

## 2020-08-08 ENCOUNTER — Other Ambulatory Visit: Payer: Self-pay | Admitting: Neurology

## 2020-08-09 MED ORDER — CYCLOBENZAPRINE HCL 5 MG PO TABS: ORAL_TABLET | ORAL | 0 refills | Status: DC

## 2020-09-02 ENCOUNTER — Other Ambulatory Visit: Payer: Self-pay | Admitting: Neurology

## 2020-09-15 ENCOUNTER — Other Ambulatory Visit: Payer: Self-pay | Admitting: Neurology

## 2020-09-15 NOTE — Telephone Encounter (Signed)
Return here PRN, future refills need to come from PCP, only 1 month supply given today

## 2020-09-15 NOTE — Telephone Encounter (Signed)
I called pt LMVM that only 30 day supply given, last note was to get from pcp.  We can see annually for med refills.  If questions to call us back.

## 2020-10-14 ENCOUNTER — Other Ambulatory Visit: Payer: Self-pay | Admitting: Neurology

## 2020-10-31 ENCOUNTER — Other Ambulatory Visit: Payer: Self-pay | Admitting: Neurology

## 2020-10-31 NOTE — Telephone Encounter (Signed)
Pt request refill pregabalin (LYRICA) 50 MG capsule at [facility]

## 2020-11-01 ENCOUNTER — Other Ambulatory Visit: Payer: Self-pay | Admitting: Neurology

## 2020-11-01 NOTE — Telephone Encounter (Signed)
Pt called wanting to know when his medication will be called in. Please advise . 

## 2020-11-02 MED ORDER — PREGABALIN 50 MG PO CAPS: ORAL_CAPSULE | ORAL | 1 refills | Status: DC

## 2020-12-12 ENCOUNTER — Ambulatory Visit: Payer: 59 | Admitting: Neurology

## 2020-12-12 NOTE — Progress Notes (Deleted)
PATIENT: Cameron Hunter DOB: 08-04-1970  REASON FOR VISIT: follow up HISTORY FROM: patient  HISTORY OF PRESENT ILLNESS: Today 12/12/20  Cameron Hunter   Update 03/15/2020 SS: Mr. [name] is a 50 year old male with history of right knee pain since June 2019.  He had good benefit from [facility] of the lumbar spine in January 2020.  His right knee pain has improved, very minimal in the low back.  He is on meloxicam, Lyrica, and Flexeril.  Is overall doing well, may have some burning to the right knee, more than anything he is fearful the intense pain will come back, there was a time he was in a w/c.  Has seen orthopedics previously, no structural issue identified to the knee.  He has lost 30 pounds, is eating better, exercising.  He wears a right knee sleeve. Unsure if some of his symptoms could be in his mind?  He works as a Social research officer, government at [employer]. He has been to [employer], and walking a lot without problem.  Is hesitant to reduce the dose of his medicines, has tried less Flexeril, felt more pain possibly?  Presents today for evaluation unaccompanied.  HISTORY 09/10/2019 SS: [name] is a 50 year old male with history of right knee pain since June 2019. He was sent for a ESI of the low back with good benefit in January 2020.  He was sent for physical therapy, he went 1 time, got some home exercises to do.  MRI of the lumbar spine showed no evidence of significant spinal stenosis, the written impression suggest possible impingement of the L3 nerve roots.  He remains on meloxicam, Lyrica, and Flexeril.  He indicates he no longer has any pain to his right knee.  Most of the pain was located in the right knee, very minimal in the low back.  He is now working, he is a Production designer, theatre/television/film at [employer], doing well.  He has had some issues with his right shoulder, reports he had blood work done in January, including kidney and liver function that was normal.  He is always worried that his knee pain is going to come back.   He presents today for follow-up unaccompanied.   REVIEW OF SYSTEMS: Out of a complete 14 system review of symptoms, the patient complains only of the following symptoms, and all other reviewed systems are negative.  Knee pain  ALLERGIES: No Known Allergies  HOME MEDICATIONS: Outpatient Medications Prior to Visit  Medication Sig Dispense Refill   Acetaminophen (TYLENOL PO) Take by mouth.     cyclobenzaprine (FLEXERIL) 5 MG tablet TAKE 1 TABLET BY MOUTH 2 TIMES DAILY AS NEEDED FOR MUSCLE SPASMS. 60 tablet 0   indomethacin (INDOCIN) 50 MG capsule Take by mouth 3 (three) times daily as needed.     lisinopril (ZESTRIL) 30 MG tablet Take 30 mg by mouth daily.     meloxicam (MOBIC) 15 MG tablet TAKE 1 TABLET BY MOUTH DAILY. FOLLOW UP ON 09/10/19 30 tablet 5   pregabalin (LYRICA) 50 MG capsule TAKE 2 CAPSULES BY MOUTH 2 TIMES DAILY. 120 capsule 1   traZODone (DESYREL) 100 MG tablet Take 100 mg by mouth at bedtime. 1 at bedtime     No facility-administered medications prior to visit.    PAST MEDICAL HISTORY: Past Medical History:  Diagnosis Date   Gout    Knee pain    Right knee     PAST SURGICAL HISTORY: No past surgical history on file.  FAMILY HISTORY: Family History  Problem Relation Age of Onset   Lung cancer Mother    Diabetes Mother    COPD Mother    Heart disease Mother    Kidney disease Mother     SOCIAL HISTORY: Social History   Socioeconomic History   Marital status: Married    Spouse name: Not on file   Number of children: Not on file   Years of education: Not on file   Highest education level: Not on file  Occupational History   Not on file  Tobacco Use   Smoking status: Never   Smokeless tobacco: Never  Vaping Use   Vaping Use: Never used  Substance and Sexual Activity   Alcohol use: Not Currently   Drug use: Never   Sexual activity: Not on file  Other Topics Concern   Not on file  Social History Narrative   Right handed    Caffeine occasional      Lives with wife at home    Social Determinants of Health   Financial Resource Strain: Not on file  Food Insecurity: Not on file  Transportation Needs: Not on file  Physical Activity: Not on file  Stress: Not on file  Social Connections: Not on file  Intimate Partner Violence: Not on file   PHYSICAL EXAM  There were no vitals filed for this visit.  There is no height or weight on file to calculate BMI.  Generalized: Well developed, in no acute distress   Neurological examination  Mentation: Alert oriented to time, place, history taking. Follows all commands speech and language fluent Cranial nerve II-XII: Pupils were equal round reactive to light. Extraocular movements were full, visual field were full on confrontational test. Facial sensation and strength were normal. Head turning and shoulder shrug  were normal and symmetric. Motor: The motor testing reveals 5 over 5 strength of all 4 extremities. Good symmetric motor tone is noted throughout.  Sensory: Sensory testing is intact to soft touch on all 4 extremities. No evidence of extinction is noted.  Coordination: Cerebellar testing reveals good finger-nose-finger and heel-to-shin bilaterally.  Gait and station: Gait is normal.  Reflexes: Deep tendon reflexes are symmetric and normal bilaterally.   DIAGNOSTIC DATA (LABS, IMAGING, TESTING) - I reviewed patient records, labs, notes, testing and imaging myself where available.  No results found for: WBC, HGB, HCT, MCV, PLT    Component Value Date/Time   NA 142 06/20/2018 0854   K 4.1 06/20/2018 0854   CL 97 06/20/2018 0854   CO2 25 06/20/2018 0854   GLUCOSE 103 (H) 06/20/2018 0854   BUN 9 06/20/2018 0854   CREATININE 0.85 06/20/2018 0854   CALCIUM 9.4 06/20/2018 0854   PROT 6.4 06/20/2018 0854   ALBUMIN 4.2 06/20/2018 0854   AST 19 06/20/2018 0854   ALT 27 06/20/2018 0854   ALKPHOS 57 06/20/2018 0854   BILITOT 0.4 06/20/2018 0854   GFRNONAA 102 06/20/2018 0854    GFRAA 118 06/20/2018 0854   No results found for: CHOL, HDL, LDLCALC, LDLDIRECT, TRIG, CHOLHDL No results found for: WLNL8X No results found for: VITAMINB12 No results found for: TSH  ASSESSMENT AND PLAN 50 y.o. year old male  has a past medical history of Gout and Knee pain. here with:  1.  Right knee discomfort -Continues to do well, since [facility] lumbar spine in January 2020 -Hesitant to reduce the dose of any medications -Continue Lyrica 100 mg twice a day (refill sent last fill 03/01/2020 # 120 last refill) -Continue Flexeril 5  mg twice a day -Continue meloxicam 15 mg daily -Continue follow-up with PCP, follow-up here as needed -Previous MRI of lumbar spine written report suggest possible impingement of L3 nerve roots, structure of the low back appears relatively unremarkable, if pain returns may consider NCV/EMG, we talked about this, if symptoms return will call our office   I spent 30 minutes of face-to-face and non-face-to-face time with patient.  This included previsit chart review, lab review, study review, order entry, electronic health record documentation, patient education.  Margie Ege, AGNP-C, DNP 12/12/2020, 5:47 AM [facility] [address], [address] [place], [place] [address] [phone]

## 2020-12-30 ENCOUNTER — Other Ambulatory Visit: Payer: Self-pay | Admitting: Neurology

## 2021-01-04 ENCOUNTER — Other Ambulatory Visit: Payer: Self-pay | Admitting: Neurology

## 2021-01-04 ENCOUNTER — Other Ambulatory Visit: Payer: Self-pay

## 2021-01-04 ENCOUNTER — Ambulatory Visit: Payer: 59 | Admitting: Internal Medicine

## 2021-01-04 ENCOUNTER — Encounter: Payer: Self-pay | Admitting: Internal Medicine

## 2021-01-04 VITALS — BP 150/90 | HR 88 | Ht 74.0 in | Wt 301.4 lb

## 2021-01-04 DIAGNOSIS — E6609 Other obesity due to excess calories: Secondary | ICD-10-CM

## 2021-01-04 DIAGNOSIS — I10 Essential (primary) hypertension: Secondary | ICD-10-CM | POA: Diagnosis not present

## 2021-01-04 DIAGNOSIS — Z6838 Body mass index (BMI) 38.0-38.9, adult: Secondary | ICD-10-CM

## 2021-01-04 DIAGNOSIS — G8929 Other chronic pain: Secondary | ICD-10-CM

## 2021-01-04 DIAGNOSIS — M5440 Lumbago with sciatica, unspecified side: Secondary | ICD-10-CM

## 2021-01-04 DIAGNOSIS — M25561 Pain in right knee: Secondary | ICD-10-CM

## 2021-01-04 MED ORDER — LISINOPRIL 30 MG PO TABS: 30.0000 mg | ORAL_TABLET | Freq: Every day | ORAL | 3 refills | Status: DC

## 2021-01-04 MED ORDER — CYCLOBENZAPRINE HCL 5 MG PO TABS: ORAL_TABLET | ORAL | 3 refills | Status: DC

## 2021-01-04 MED ORDER — PREGABALIN 50 MG PO CAPS: ORAL_CAPSULE | ORAL | 1 refills | Status: DC

## 2021-01-04 MED ORDER — MELOXICAM 15 MG PO TABS: ORAL_TABLET | ORAL | 3 refills | Status: DC

## 2021-01-04 NOTE — Assessment & Plan Note (Signed)
Refer to orthopedics 

## 2021-01-04 NOTE — Assessment & Plan Note (Signed)

## 2021-01-04 NOTE — Assessment & Plan Note (Signed)
-   Patient's back pain is under control with medication.  - Encouraged the patient to stretch or do yoga as able to help with back pain 

## 2021-01-04 NOTE — Assessment & Plan Note (Signed)

## 2021-01-04 NOTE — Progress Notes (Signed)
Established Patient Office Visit  Subjective:  Patient ID: Cameron Hunter, male    DOB: 1970-09-06  Age: 50 y.o. MRN: [id number]  CC:  Chief Complaint  Patient presents with   New Patient (Initial Visit)    Back Pain The current episode started more than 1 year ago. The problem occurs intermittently. The problem has been gradually improving since onset. The pain is at a severity of 4/10. The pain is mild. Pertinent negatives include no abdominal pain, bladder incontinence, chest pain or headaches. The treatment provided moderate relief.   Cameron Hunter presents for new pt visit  Past Medical History:  Diagnosis Date   Gout    Knee pain    Right knee     No past surgical history on file.  Family History  Problem Relation Age of Onset   Lung cancer Mother    Diabetes Mother    COPD Mother    Heart disease Mother    Kidney disease Mother     Social History   Socioeconomic History   Marital status: Married    Spouse name: Not on file   Number of children: Not on file   Years of education: Not on file   Highest education level: Not on file  Occupational History   Not on file  Tobacco Use   Smoking status: Never   Smokeless tobacco: Never  Vaping Use   Vaping Use: Never used  Substance and Sexual Activity   Alcohol use: Not Currently   Drug use: Never   Sexual activity: Not on file  Other Topics Concern   Not on file  Social History Narrative   Right handed    Caffeine occasional     Lives with wife at home    Social Determinants of Health   Financial Resource Strain: Not on file  Food Insecurity: Not on file  Transportation Needs: Not on file  Physical Activity: Not on file  Stress: Not on file  Social Connections: Not on file  Intimate Partner Violence: Not on file     Current Outpatient Medications:    Acetaminophen (TYLENOL PO), Take by mouth., Disp: , Rfl:    cyclobenzaprine (FLEXERIL) 5 MG tablet, TAKE 1 TABLET BY MOUTH 2 TIMES DAILY AS NEEDED  FOR MUSCLE SPASMS., Disp: 60 tablet, Rfl: 0   indomethacin (INDOCIN) 50 MG capsule, Take by mouth 3 (three) times daily as needed., Disp: , Rfl:    lisinopril (ZESTRIL) 30 MG tablet, Take 30 mg by mouth daily., Disp: , Rfl:    meloxicam (MOBIC) 15 MG tablet, TAKE 1 TABLET BY MOUTH DAILY. FOLLOW UP ON 09/10/19, Disp: 30 tablet, Rfl: 5   pregabalin (LYRICA) 50 MG capsule, TAKE 2 CAPSULES BY MOUTH 2 TIMES DAILY., Disp: 120 capsule, Rfl: 1   No Known Allergies  ROS Review of Systems  Constitutional: Negative.   HENT: Negative.    Eyes: Negative.   Respiratory: Negative.    Cardiovascular: Negative.  Negative for chest pain.  Gastrointestinal: Negative.  Negative for abdominal pain.  Endocrine: Negative.   Genitourinary: Negative.  Negative for bladder incontinence.  Musculoskeletal:  Positive for arthralgias and back pain. Negative for gait problem.  Allergic/Immunologic: Negative for food allergies.  Neurological:  Negative for headaches.  Hematological:  Negative for adenopathy.     Objective:    Physical Exam Constitutional:      Appearance: Normal appearance. He is obese.  HENT:     Head: Atraumatic.  Cardiovascular:     Rate  and Rhythm: Normal rate.     Pulses: Normal pulses.  Pulmonary:     Effort: Pulmonary effort is normal. No respiratory distress.     Breath sounds: No wheezing or rales.  Abdominal:     General: There is no distension.     Tenderness: There is no guarding.  Musculoskeletal:     Comments: Back pain  Skin:    Coloration: Skin is not pale.  Neurological:     General: No focal deficit present.     Mental Status: He is alert.     Motor: No weakness.  Psychiatric:        Mood and Affect: Mood normal.    BP (!) 150/90   Pulse 88   Ht 6\' 2"  (1.88 m)   Wt (!) 301 lb 6.4 oz (136.7 kg)   BMI 38.70 kg/m  Wt Readings from Last 3 Encounters:  01/04/21 (!) 301 lb 6.4 oz (136.7 kg)  03/15/20 292 lb 12.8 oz (132.8 kg)  09/10/19 (!) 320 lb (145.2 kg)      Health Maintenance Due  Topic Date Due   HIV Screening  Never done   Hepatitis C Screening  Never done   COLONOSCOPY (Pts 45-7yrs Insurance coverage will need to be confirmed)  Never done   Zoster Vaccines- Shingrix (1 of 2) Never done   COVID-19 Vaccine (3 - Booster for Pfizer series) 02/12/2020    There are no preventive care reminders to display for this patient.  No results found for: TSH No results found for: WBC, HGB, HCT, MCV, PLT Lab Results  Component Value Date   NA 142 06/20/2018   K 4.1 06/20/2018   CO2 25 06/20/2018   GLUCOSE 103 (H) 06/20/2018   BUN 9 06/20/2018   CREATININE 0.85 06/20/2018   BILITOT 0.4 06/20/2018   ALKPHOS 57 06/20/2018   AST 19 06/20/2018   ALT 27 06/20/2018   PROT 6.4 06/20/2018   ALBUMIN 4.2 06/20/2018   CALCIUM 9.4 06/20/2018   No results found for: CHOL No results found for: HDL No results found for: LDLCALC No results found for: TRIG No results found for: CHOLHDL No results found for: 08/19/2018    Assessment & Plan:   Problem List Items Addressed This Visit   None   No orders of the defined types were placed in this encounter.   Follow-up: No follow-ups on file.    [id number], MD

## 2021-02-08 ENCOUNTER — Ambulatory Visit: Payer: 59 | Admitting: Internal Medicine

## 2021-03-02 ENCOUNTER — Ambulatory Visit: Payer: Self-pay

## 2021-03-02 ENCOUNTER — Ambulatory Visit: Payer: 59 | Admitting: Family Medicine

## 2021-03-02 ENCOUNTER — Ambulatory Visit (INDEPENDENT_AMBULATORY_CARE_PROVIDER_SITE_OTHER): Payer: 59

## 2021-03-02 ENCOUNTER — Other Ambulatory Visit: Payer: Self-pay

## 2021-03-02 VITALS — BP 158/112 | HR 86 | Ht 74.0 in | Wt 305.8 lb

## 2021-03-02 DIAGNOSIS — M25562 Pain in left knee: Secondary | ICD-10-CM

## 2021-03-02 NOTE — Progress Notes (Signed)
Subjective:    CC: L knee pain  I, Cameron Hunter, LAT, ATC, am serving as scribe for Dr. Clementeen Hunter.  HPI: Pt is a 50 y/o male presenting w/ L knee pain ongoing since Saturday night. Pt notes he plays a lot of golf and walks a lot a work on concrete floors. He locates his pain to the anterior aspect w/ a "pulling" sensation in the posterior aspect of his L knee.  L knee swelling: yes- but has improved L knee mechanical symptoms: no Aggravating factors: deep knee flexion Treatments tried: Lyrica, compression sleeve, ice, elevation, IBU   Pertinent review of Systems: No fevers or chills  Relevant historical information: History of prior lumbar radiculopathy at right L3 treated with epidural steroid injection.   Objective:    Vitals:   03/02/21 1128  BP: (!) 158/112  Pulse: 86  SpO2: 96%   General: Well Developed, well nourished, and in no acute distress.   MSK: Left knee moderate to large effusion.   Decreased range of motion of flexion normal ankle range of motion to extension.  Crepitation with motion. Tender palpation medial joint line.   Positive medial McMurray's test. Light laxity medial joint line. Intact strength.   Lab and Radiology Results  Procedure: Real-time Ultrasound Guided Injection of left knee superior lateral patellar space Device: Philips Affiniti 50G Images permanently stored and available for review in PACS Ultrasound evaluation prior to injection reveals intact quad and patellar tendon. Moderate joint effusion. Narrowed medial joint line with partially extruded medial meniscus. Mildly narrowed lateral joint line. No severe or large Baker's cyst. Verbal informed consent obtained.  Discussed risks and benefits of procedure. Warned about infection bleeding damage to structures skin hypopigmentation and fat atrophy among others. Patient expresses understanding and agreement Time-out conducted.   Noted no overlying erythema, induration, or  other signs of local infection.   Skin prepped in a sterile fashion.   Local anesthesia: Topical Ethyl chloride.   With sterile technique and under real time ultrasound guidance: 40 Milligrams of Kenalog and 2 mL of lidocaine injected into knee joint. Fluid seen entering the joint capsule.   Completed without difficulty   Pain immediately resolved suggesting accurate placement of the medication.   Advised to call if fevers/chills, erythema, induration, drainage, or persistent bleeding.   Images permanently stored and available for review in the ultrasound unit.  Impression: Technically successful ultrasound guided injection.    X-ray images left knee obtained today personally and independently interpreted Moderate to severe medial compartment DJD.  Mild to moderate patellofemoral DJD.  No acute fractures. Await formal radiology review   Description of MRI lumbar 05/27/18: Possibly from [facility].   1.  A transitional lumbosacral vertebra which is called L5 for the purposes of study.  If surgery or any other procedure is contemplated the numbering system that was used for the study should be carefully scrutinized. 2.  Multilevel degenerative changes of the lumbar spine, most prominent at L2-3. 3.  At L2-3, mild to moderate central canal stenosis with probable encroachment on the L3 nerve roots. 4.  At L3-4, a posterior disc osteophyte complex with superimposed small central caudal disc extrusion.  The disc contacts but does not displace the L4 nerve roots. 5.  At L4-5, mild bilateral lateral recess stenosis.  No neural displacement or encroachment. 6.  Probable chronic lumbar spinous process impingement at L1-2, L2-3, and L3-4.  Recommend clinical correlation.       Impression and Recommendations:  Assessment and Plan: 50 y.o. male with left knee pain thought to be due to exacerbation of medial DJD and probable exacerbation of a degenerative meniscus tear.  Patient has  changed his golf swing which is excessively loads his left knee which is probably the underlying cause here. Plan for steroid injection today, modification of golf swing, Voltaren gel, compression sleeve and recheck back if not improving especially in about 6 weeks.  Ultimately Ulis will likely require total knee replacement at some point. He potentially could benefit from a medial off loader knee brace as well.  His little bit of laxity and medial compartment degenerative changes would benefit from this.  PDMP not reviewed this encounter. Orders Placed This Encounter  Procedures   [place] LIMITED JOINT SPACE STRUCTURES LOW LEFT(NO LINKED CHARGES)    Standing Status:   Future    Number of Occurrences:   1    Standing Expiration Date:   08/30/2021    Order Specific Question:   Reason for Exam (SYMPTOM  OR DIAGNOSIS REQUIRED)    Answer:   left knee pain    Order Specific Question:   Preferred imaging location?    Answer:   [facility] Knee AP/LAT W/Sun[facility] Left    Standing Status:   Future    Number of Occurrences:   1    Standing Expiration Date:   03/02/2022    Order Specific Question:   Reason for Exam (SYMPTOM  OR DIAGNOSIS REQUIRED)    Answer:   left knee pain    Order Specific Question:   Preferred imaging location?    Answer:   [facility]   No orders of the defined types were placed in this encounter.   Discussed warning signs or symptoms. Please see discharge instructions. Patient expresses understanding.   The above documentation has been reviewed and is accurate and complete Cameron Hunter, M.D.

## 2021-03-02 NOTE — Patient Instructions (Addendum)
Thank you for coming in today.   Please get an Xray today before you leave   Call or go to the ER if you develop a large red swollen joint with extreme pain or oozing puss.    Compression will help.   Please use Voltaren gel (Generic Diclofenac Gel) up to 4x daily for pain as needed.  This is available over-the-counter as both the name brand Voltaren gel and the generic diclofenac gel.   Recheck in 6 weeks.   Let me know if this is not doing well.

## 2021-03-03 ENCOUNTER — Other Ambulatory Visit: Payer: Self-pay | Admitting: Internal Medicine

## 2021-03-03 DIAGNOSIS — M5440 Lumbago with sciatica, unspecified side: Secondary | ICD-10-CM

## 2021-03-06 ENCOUNTER — Ambulatory Visit: Payer: 59 | Admitting: Internal Medicine

## 2021-03-06 NOTE — Progress Notes (Signed)
Left knee x-ray shows medium arthritis.

## 2021-03-07 ENCOUNTER — Ambulatory Visit (INDEPENDENT_AMBULATORY_CARE_PROVIDER_SITE_OTHER): Payer: 59 | Admitting: Internal Medicine

## 2021-03-07 ENCOUNTER — Other Ambulatory Visit: Payer: Self-pay

## 2021-03-07 ENCOUNTER — Encounter: Payer: Self-pay | Admitting: Internal Medicine

## 2021-03-07 VITALS — BP 160/100 | HR 82 | Ht 74.0 in | Wt 305.9 lb

## 2021-03-07 DIAGNOSIS — M5440 Lumbago with sciatica, unspecified side: Secondary | ICD-10-CM

## 2021-03-07 DIAGNOSIS — M25561 Pain in right knee: Secondary | ICD-10-CM | POA: Diagnosis not present

## 2021-03-07 DIAGNOSIS — Z6838 Body mass index (BMI) 38.0-38.9, adult: Secondary | ICD-10-CM

## 2021-03-07 DIAGNOSIS — I10 Essential (primary) hypertension: Secondary | ICD-10-CM

## 2021-03-07 DIAGNOSIS — E6609 Other obesity due to excess calories: Secondary | ICD-10-CM

## 2021-03-07 DIAGNOSIS — G8929 Other chronic pain: Secondary | ICD-10-CM

## 2021-03-07 MED ORDER — AMLODIPINE BESYLATE 10 MG PO TABS: 10.0000 mg | ORAL_TABLET | Freq: Every day | ORAL | 3 refills | Status: DC

## 2021-03-07 MED ORDER — PREGABALIN 50 MG PO CAPS: 50.0000 mg | ORAL_CAPSULE | Freq: Two times a day (BID) | ORAL | 1 refills | Status: DC

## 2021-03-07 NOTE — Assessment & Plan Note (Signed)
Stable at the present time. 

## 2021-03-07 NOTE — Assessment & Plan Note (Signed)
-   Patient's back pain is under control with medication.  - Encouraged the patient to stretch or do yoga as able to help with back pain 

## 2021-03-07 NOTE — Assessment & Plan Note (Signed)

## 2021-03-07 NOTE — Assessment & Plan Note (Signed)

## 2021-03-07 NOTE — Progress Notes (Signed)
Established Patient Office Visit  Subjective:  Patient ID: Cameron Hunter, male    DOB: 1970-06-20  Age: 50 y.o. MRN: [id number]  CC:  Chief Complaint  Patient presents with   Follow-up    Follow up. Patient need Lyrica refill.     HPI  Cameron Hunter presents for back pain,  Past Medical History:  Diagnosis Date   Gout    Knee pain    Right knee     History reviewed. No pertinent surgical history.  Family History  Problem Relation Age of Onset   Lung cancer Mother    Diabetes Mother    COPD Mother    Heart disease Mother    Kidney disease Mother     Social History   Socioeconomic History   Marital status: Married    Spouse name: Not on file   Number of children: Not on file   Years of education: Not on file   Highest education level: Not on file  Occupational History   Not on file  Tobacco Use   Smoking status: Never   Smokeless tobacco: Never  Vaping Use   Vaping Use: Never used  Substance and Sexual Activity   Alcohol use: Not Currently   Drug use: Never   Sexual activity: Not on file  Other Topics Concern   Not on file  Social History Narrative   Right handed    Caffeine occasional     Lives with wife at home    Social Determinants of Health   Financial Resource Strain: Not on file  Food Insecurity: Not on file  Transportation Needs: Not on file  Physical Activity: Not on file  Stress: Not on file  Social Connections: Not on file  Intimate Partner Violence: Not on file     Current Outpatient Medications:    amLODipine (NORVASC) 10 MG tablet, Take 1 tablet (10 mg total) by mouth daily., Disp: 90 tablet, Rfl: 3   Acetaminophen (TYLENOL PO), Take by mouth., Disp: , Rfl:    cyclobenzaprine (FLEXERIL) 5 MG tablet, TAKE 1 TABLET BY MOUTH 2 TIMES DAILY AS NEEDED FOR MUSCLE SPASMS., Disp: 180 tablet, Rfl: 3   indomethacin (INDOCIN) 50 MG capsule, Take by mouth 3 (three) times daily as needed., Disp: , Rfl:    lisinopril (ZESTRIL) 30 MG tablet,  Take 1 tablet (30 mg total) by mouth daily., Disp: 90 tablet, Rfl: 3   meloxicam (MOBIC) 15 MG tablet, TAKE 1 TABLET BY MOUTH DAILY. FOLLOW UP ON 09/10/19, Disp: 90 tablet, Rfl: 3   pregabalin (LYRICA) 50 MG capsule, Take 1 capsule (50 mg total) by mouth 2 (two) times daily., Disp: 120 capsule, Rfl: 1   No Known Allergies  ROS Review of Systems  Constitutional: Negative.   HENT: Negative.    Eyes: Negative.   Respiratory: Negative.    Cardiovascular: Negative.   Gastrointestinal: Negative.   Endocrine: Negative.   Genitourinary: Negative.   Musculoskeletal: Negative.   Skin: Negative.   Allergic/Immunologic: Negative.   Neurological: Negative.   Hematological: Negative.   Psychiatric/Behavioral: Negative.    All other systems reviewed and are negative.    Objective:    Physical Exam Vitals reviewed.  Constitutional:      Appearance: Normal appearance.  HENT:     Mouth/Throat:     Mouth: Mucous membranes are moist.  Eyes:     Pupils: Pupils are equal, round, and reactive to light.  Neck:     Vascular: No carotid bruit.  Cardiovascular:  Rate and Rhythm: Normal rate and regular rhythm.     Pulses: Normal pulses.     Heart sounds: Normal heart sounds.  Pulmonary:     Effort: Pulmonary effort is normal.     Breath sounds: Normal breath sounds.  Abdominal:     General: Bowel sounds are normal.     Palpations: Abdomen is soft. There is no hepatomegaly, splenomegaly or mass.     Tenderness: There is no abdominal tenderness.     Hernia: No hernia is present.  Musculoskeletal:     Cervical back: Neck supple.     Right lower leg: No edema.     Left lower leg: No edema.  Skin:    Findings: No rash.  Neurological:     Mental Status: He is alert and oriented to person, place, and time.     Motor: No weakness.  Psychiatric:        Mood and Affect: Mood normal.        Behavior: Behavior normal.    BP (!) 160/100   Pulse 82   Ht 6\' 2"  (1.88 m)   Wt (!) 305 lb  14.4 oz (138.8 kg)   BMI 39.28 kg/m  Wt Readings from Last 3 Encounters:  03/07/21 (!) 305 lb 14.4 oz (138.8 kg)  03/02/21 (!) 305 lb 12.8 oz (138.7 kg)  01/04/21 (!) 301 lb 6.4 oz (136.7 kg)     Health Maintenance Due  Topic Date Due   HIV Screening  Never done   Hepatitis C Screening  Never done   COLONOSCOPY (Pts 45-69yrs Insurance coverage will need to be confirmed)  Never done   Zoster Vaccines- Shingrix (1 of 2) Never done   COVID-19 Vaccine (3 - Booster for Pfizer series) 02/12/2020   INFLUENZA VACCINE  Never done    There are no preventive care reminders to display for this patient.  No results found for: TSH No results found for: WBC, HGB, HCT, MCV, PLT Lab Results  Component Value Date   NA 142 06/20/2018   K 4.1 06/20/2018   CO2 25 06/20/2018   GLUCOSE 103 (H) 06/20/2018   BUN 9 06/20/2018   CREATININE 0.85 06/20/2018   BILITOT 0.4 06/20/2018   ALKPHOS 57 06/20/2018   AST 19 06/20/2018   ALT 27 06/20/2018   PROT 6.4 06/20/2018   ALBUMIN 4.2 06/20/2018   CALCIUM 9.4 06/20/2018   No results found for: CHOL No results found for: HDL No results found for: LDLCALC No results found for: TRIG No results found for: CHOLHDL No results found for: 08/19/2018    Assessment & Plan:   Problem List Items Addressed This Visit       Cardiovascular and Mediastinum   Primary hypertension - Primary     Patient denies any chest pain or shortness of breath there is no history of palpitation or paroxysmal nocturnal dyspnea   patient was advised to follow low-salt low-cholesterol diet    ideally I want to keep systolic blood pressure below [id number] mmHg, patient was asked to check blood pressure one times a week and give me a report on that.  Patient will be follow-up in 3 months  or earlier as needed, patient will call me back for any change in the cardiovascular symptoms Patient was advised to buy a book from local bookstore concerning blood pressure and read several chapters   every day.  This will be supplemented by some of the material we will give him from the office.  Patient should also utilize other resources like YouTube and Internet to learn more about the blood pressure and the diet.      Relevant Medications   amLODipine (NORVASC) 10 MG tablet     Nervous and Auditory   Lumbago of lumbar region with sciatica    - Patient's back pain is under control with medication.  - Encouraged the patient to stretch or do yoga as able to help with back pain      Relevant Medications   pregabalin (LYRICA) 50 MG capsule     Other   Knee pain    Stable at the present time      Class 2 obesity due to excess calories without serious comorbidity with body mass index (BMI) of 38.0 to 38.9 in adult    - I encouraged the patient to lose weight.  - I educated them on making healthy dietary choices including eating more fruits and vegetables and less fried foods. - I encouraged the patient to exercise more, and educated on the benefits of exercise including weight loss, diabetes prevention, and hypertension prevention.   Dietary counseling with a registered dietician  Referral to a weight management support group (e.g. Weight Watchers, Overeaters Anonymous)  If your BMI is greater than 29 or you have gained more than 15 pounds you should work on weight loss.  Attend a healthy cooking class        Meds ordered this encounter  Medications   pregabalin (LYRICA) 50 MG capsule    Sig: Take 1 capsule (50 mg total) by mouth 2 (two) times daily.    Dispense:  120 capsule    Refill:  1   amLODipine (NORVASC) 10 MG tablet    Sig: Take 1 tablet (10 mg total) by mouth daily.    Dispense:  90 tablet    Refill:  3    Follow-up: No follow-ups on file.    Corky Downs, MD

## 2021-03-14 ENCOUNTER — Other Ambulatory Visit: Payer: Self-pay | Admitting: *Deleted

## 2021-03-14 DIAGNOSIS — M5440 Lumbago with sciatica, unspecified side: Secondary | ICD-10-CM

## 2021-03-14 MED ORDER — PREGABALIN 50 MG PO CAPS: 100.0000 mg | ORAL_CAPSULE | Freq: Two times a day (BID) | ORAL | 1 refills | Status: DC

## 2021-03-14 MED ORDER — PREGABALIN 50 MG PO CAPS: 50.0000 mg | ORAL_CAPSULE | Freq: Two times a day (BID) | ORAL | 1 refills | Status: DC

## 2021-04-13 ENCOUNTER — Ambulatory Visit: Payer: 59 | Admitting: Family Medicine

## 2021-05-08 ENCOUNTER — Ambulatory Visit: Payer: 59 | Admitting: Internal Medicine

## 2021-06-15 NOTE — Progress Notes (Signed)
I, Christoper Fabian, LAT, ATC, am serving as scribe for Dr. Clementeen Graham.  Cameron Hunter is a 50 y.o. male who presents to [facility] today for L wrist pain x approximately 2 weeks.  He states that he may have "jammed" his wrist by hitting it on something.  He was last seen by Dr. Denyse Amass on 03/02/21 for L knee pain.  Today, pt reports L wrist pain x approximately 2 weeks.  He locates his pain to his L dorsal, ulnar wrist pain.  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  He can only get his right hand to approximately 90 degrees supinated. He does note some palmar pain in the right hand that he suspects is carpal tunnel syndrome.  Wrist swelling: slightly Wrist mechanical symptoms: no Aggravating factors: L wrist pronation Treatments tried: L wrist brace; IcyHot; Salonpas   Pertinent review of systems: No fevers or chills  Relevant historical information: History of gout typically treated with indomethacin.  Formally took allopurinol but no longer takes it.    Exam:  BP (!) 170/100 (BP Location: Left Arm, Patient Position: Sitting, Cuff Size: Large)    Pulse 88    Ht 6\' 2"  (1.88 m)    Wt (!) 319 lb 6.4 oz (144.9 kg)    SpO2 94%    BMI 41.01 kg/m  General: Well Developed, well nourished, and in no acute distress.   MSK:  Left wrist normal-appearing Tender palpation dorsal and ulnar sided left wrist.  Some tenderness at ulnar styloid and at TFCC region.  Some pain with resisted wrist extension. Stable ligamentous exam.  Right wrist decreased range of motion to supination 90 degrees as noted above. Nontender.  Positive Tinel's carpal tunnel.   Lab and Radiology Results  Diagnostic Limited MSK Ultrasound of: Bilateral wrist Left wrist: Wrist effusion. Mild hypoechoic fluid surrounds ECU tendon. No definitive tear at [facility] Structures otherwise normal-appearing Right wrist: Carpal tunnel.  Median nerve slightly enlarged  measuring just over 15 mm cross-sectional area Impression: Left: Mild ECU tendinitis and left wrist effusion.  Right: Mild carpal tunnel syndrome   X-ray left wrist obtained today personally and independently interpreted No acute fractures.  Mild degenerative changes. Await formal radiology review   Assessment and Plan: 50 y.o. male with left wrist dorsal pain.  Unclear diagnosis.  Differential at this time includes both gout flare, and ECU tendinitis. Plan for labs to assess for gout listed below, and home exercise program taught in clinic today by ATC for ECU tendinitis.  Refill indomethacin.  Recheck in 1 month  Right wrist carpal tunnel syndrome most likely.  Plan for carpal tunnel brace.  Recheck in a month.  PDMP not reviewed this encounter. Orders Placed This Encounter  Procedures   44 LIMITED JOINT SPACE STRUCTURES UP LEFT(NO LINKED CHARGES)    Order Specific Question:   Reason for Exam (SYMPTOM  OR DIAGNOSIS REQUIRED)    Answer:   L wrist pain    Order Specific Question:   Preferred imaging location?    Answer:   [facility] Wrist Complete Left    Standing Status:   Future    Number of Occurrences:   1    Standing Expiration Date:   07/21/2021    Order Specific Question:   Reason for Exam (SYMPTOM  OR DIAGNOSIS REQUIRED)    Answer:   L wrist pain    Order Specific Question:  Preferred imaging location?    Answer:   [facility]   Basic metabolic panel    Standing Status:   Future    Number of Occurrences:   1    Standing Expiration Date:   07/21/2021    Order Specific Question:   Has the patient fasted?    Answer:   No   Uric acid    Standing Status:   Future    Number of Occurrences:   1    Standing Expiration Date:   07/21/2021   Meds ordered this encounter  Medications   indomethacin (INDOCIN) 50 MG capsule    Sig: Take 1 capsule (50 mg total) by mouth 3 (three) times daily as needed.    Dispense:  30 capsule    Refill:  1      Discussed warning signs or symptoms. Please see discharge instructions. Patient expresses understanding.   The above documentation has been reviewed and is accurate and complete Clementeen Graham, M.D.

## 2021-06-20 ENCOUNTER — Ambulatory Visit: Payer: 59 | Admitting: Family Medicine

## 2021-06-20 ENCOUNTER — Encounter: Payer: Self-pay | Admitting: Family Medicine

## 2021-06-20 ENCOUNTER — Ambulatory Visit: Payer: Self-pay

## 2021-06-20 ENCOUNTER — Ambulatory Visit (INDEPENDENT_AMBULATORY_CARE_PROVIDER_SITE_OTHER): Payer: 59

## 2021-06-20 ENCOUNTER — Other Ambulatory Visit: Payer: Self-pay

## 2021-06-20 VITALS — BP 170/100 | HR 88 | Ht 74.0 in | Wt 319.4 lb

## 2021-06-20 DIAGNOSIS — G5601 Carpal tunnel syndrome, right upper limb: Secondary | ICD-10-CM

## 2021-06-20 DIAGNOSIS — M1A0320 Idiopathic chronic gout, left wrist, without tophus (tophi): Secondary | ICD-10-CM | POA: Diagnosis not present

## 2021-06-20 DIAGNOSIS — M25532 Pain in left wrist: Secondary | ICD-10-CM | POA: Diagnosis not present

## 2021-06-20 LAB — BASIC METABOLIC PANEL
BUN: 18 mg/dL (ref 6–23)
CO2: 29 mEq/L (ref 19–32)
Calcium: 9.9 mg/dL (ref 8.4–10.5)
Chloride: 99 mEq/L (ref 96–112)
Creatinine, Ser: 1.15 mg/dL (ref 0.40–1.50)
GFR: 73.39 mL/min (ref >60.00)
Glucose, Bld: 144 mg/dL — ABNORMAL HIGH (ref 70–99)
Potassium: 4.6 mEq/L (ref 3.5–5.1)
Sodium: 137 mEq/L (ref 135–145)

## 2021-06-20 LAB — URIC ACID: Uric Acid, Serum: 7.9 mg/dL — ABNORMAL HIGH (ref 4.0–7.8)

## 2021-06-20 MED ORDER — INDOMETHACIN 50 MG PO CAPS: 50.0000 mg | ORAL_CAPSULE | Freq: Three times a day (TID) | ORAL | 1 refills | Status: DC | PRN

## 2021-06-20 NOTE — Patient Instructions (Signed)
Good to see you today.  Please get an Xray today before you leave.  Please get labs today before you leave   Uric acid and Metabolic panel  Please perform the exercise program that we have prepared for you and gone over in detail on a daily basis.  In addition to the handout you were provided you can access your program through: [email or web address]   Your unique program code is:  [email or web address]  Please get a carpal tunnel brace for your R wrist.  Follow-up: one month  Happy New Year!

## 2021-06-21 MED ORDER — ALLOPURINOL 300 MG PO TABS: 300.0000 mg | ORAL_TABLET | Freq: Every day | ORAL | 1 refills | Status: DC

## 2021-06-21 NOTE — Progress Notes (Signed)
Some arthritis is present in the hand but the wrist itself looks okay.

## 2021-06-21 NOTE — Addendum Note (Signed)
Addended by: Rodolph Bong on: 06/21/2021 07:14 AM   Modules accepted: Orders

## 2021-06-21 NOTE — Progress Notes (Signed)
Uric acid is elevated at 7.9.  Plan to restart allopurinol to lower uric acid. Take 300 mg pill once daily. Plan to recheck labs in about 6 weeks.

## 2021-06-29 ENCOUNTER — Telehealth: Payer: Self-pay | Admitting: Family Medicine

## 2021-06-29 ENCOUNTER — Encounter: Payer: Self-pay | Admitting: Physical Therapy

## 2021-06-29 NOTE — Telephone Encounter (Signed)
Yes I would like to see you tomorrow. Allopurinol will not work as quickly as you want it to. We need to also do something else.  Cameron Hunter

## 2021-06-29 NOTE — Telephone Encounter (Signed)
Pt seen 1/3, gout. Started allopurinal 1/5 or 1/6.  R knee started being really painful yesterday, almost unbearable. No injury, suspects gout.  Pt read that meds can take awhile and possible make things worse before better.  He has an appt 1/13, does he need to come in or should he wait out the meds for a bit longer.  Please respond via MyChart per pt request.

## 2021-06-29 NOTE — Telephone Encounter (Signed)
Sent pt a MyChart message per his request.  Pt is to come to the office tomorrow for his scheduled appt.

## 2021-06-30 ENCOUNTER — Other Ambulatory Visit: Payer: Self-pay

## 2021-06-30 ENCOUNTER — Encounter: Payer: Self-pay | Admitting: Family Medicine

## 2021-06-30 ENCOUNTER — Ambulatory Visit: Payer: Self-pay

## 2021-06-30 ENCOUNTER — Telehealth: Payer: Self-pay | Admitting: Physical Therapy

## 2021-06-30 ENCOUNTER — Ambulatory Visit: Payer: 59 | Admitting: Family Medicine

## 2021-06-30 VITALS — BP 170/88 | HR 99 | Ht 74.0 in | Wt 319.4 lb

## 2021-06-30 DIAGNOSIS — M5416 Radiculopathy, lumbar region: Secondary | ICD-10-CM

## 2021-06-30 DIAGNOSIS — M25561 Pain in right knee: Secondary | ICD-10-CM | POA: Diagnosis not present

## 2021-06-30 MED ORDER — GABAPENTIN 300 MG PO CAPS: 300.0000 mg | ORAL_CAPSULE | Freq: Three times a day (TID) | ORAL | 2 refills | Status: DC

## 2021-06-30 NOTE — Telephone Encounter (Signed)
Called pt after he left his appt this morning to advise that he is NOT to take the Gabapentin.  Pt is already taking Lyrica and is not supposed to take Lyrica and Gabapentin.  Continue Lyrica.  Left message on pt's VM.  If pt returns call, advise of above.

## 2021-06-30 NOTE — Progress Notes (Signed)
I, Cameron Hunter, LAT, ATC, am serving as scribe for Dr. Lynne Leader.  Cameron Hunter is a 51 y.o. male who presents to [place] at [facility] today for R knee pain. Pt was last seen by Dr. Georgina Snell on 06/20/20 for gout in his L wrist pain and was taught a HEP. He was also prescribed Allopurinol and indomethacin.  Pt called the office on 1/12 reporting "severe" R knee pain ongoing since 06/28/21 w/ no MOI. Pt locates pain to his R med/lat knee.  He has a hx of prior R knee pain in 2019.  He states that his R knee is feeling better this morning.  He reports that in 2019, his R knee pain was ultimately due to lumbar radiculopathy.  He feels like his R knee pain today is the same as it was in 2019.  R knee swelling: no Aggravates: weight-bearing activity Treatments tried: allopurinol; knee brace  Dx testing: 06/20/21 Labs (uric acid = 7.9, BMET)             03/02/21 - L knee XR  05/14/18 R knee XR               Pertinent review of systems: No fevers or chills  Relevant historical information: History of lumbar radiculopathy at right L3.  He had an MRI 2019 and an epidural steroid injection in 2020 which helped quite a bit.   Exam:  BP (!) 170/88 (BP Location: Left Arm, Patient Position: Sitting, Cuff Size: Large)    Pulse 99    Ht 6\' 2"  (1.88 m)    Wt (!) 319 lb 6.4 oz (144.9 kg)    SpO2 94%    BMI 41.01 kg/m  General: Well Developed, well nourished, and in no acute distress.   MSK: L-spine nontender midline decreased lumbar motion positive right-sided slump test. Lower extremity strength is intact.  Right knee normal-appearing Nontender. Normal motion. Stable ligamentous exam. Intact strength.    Lab and Radiology Results  Diagnostic Limited MSK Ultrasound of: Right knee Quad tendon intact.  Osteophyte superior patellar pole. No significant joint effusion. Patellar tendon normal. Lateral joint line normal.  Next medial joint line degenerative with partial extruded  medial meniscus. Posterior knee no Baker's cyst. Impression: Mild degenerative changes medial joint line.  No large joint effusion.   Description of MRI lumbar 05/27/18: Possibly from [facility].   1.  A transitional lumbosacral vertebra which is called L5 for the purposes of study.  If surgery or any other procedure is contemplated the numbering system that was used for the study should be carefully scrutinized. 2.  Multilevel degenerative changes of the lumbar spine, most prominent at L2-3. 3.  At L2-3, mild to moderate central canal stenosis with probable encroachment on the L3 nerve roots. 4.  At L3-4, a posterior disc osteophyte complex with superimposed small central caudal disc extrusion.  The disc contacts but does not displace the L4 nerve roots. 5.  At L4-5, mild bilateral lateral recess stenosis.  No neural displacement or encroachment. 6.  Probable chronic lumbar spinous process impingement at L1-2, L2-3, and L3-4.  Recommend clinical correlation.   Lab Results  Component Value Date   LABURIC 7.9 (H) 06/20/2021     Assessment and Plan: 51 y.o. male with  Right anterior knee pain..  Based on his prior history of lumbar radiculopathy that is consistent with his current pain and positive slump test and absence of joint effusion his current pain is thought  to be due to lumbar radiculopathy.  Already taking Lyrica.  Gabapentin mistakenly prescribed and canceled.  Plan to treat with epidural steroid injection.  If this does not help would recommend repeat lumbar imaging including x-ray and MRI prior to subsequent repeat epidural steroid injections.  We also discussed his gout.  Doing well on allopurinol as he is starting to have flareups we will add colchicine.   PDMP not reviewed this encounter. Orders Placed This Encounter  Procedures   [place] LIMITED JOINT SPACE STRUCTURES LOW RIGHT(NO LINKED CHARGES)    Order Specific Question:   Reason for Exam (SYMPTOM  OR DIAGNOSIS  REQUIRED)    Answer:   r knee pain    Order Specific Question:   Preferred imaging location?    Answer:   [place]   DG INJECT DIAG/THERA/INC NEEDLE/CATH/PLC EPI/LUMB/SAC W/IMG    LUMBAR EPI #1 [employer]HC 319 LBS PRIOR MRI L-SPINE @ [facility] 2019 NO THINS/OTC *SCREENED*    Standing Status:   Future    Standing Expiration Date:   06/30/2022    Order Specific Question:   Reason for Exam (SYMPTOM  OR DIAGNOSIS REQUIRED)    Answer:    Probably L3. Level and technique per radiology    Order Specific Question:   Preferred Imaging Location?    Answer:   [facility]    Order Specific Question:   Radiology Contrast Protocol - do NOT remove file path    Answer:   \[email or web address]   Meds ordered this encounter  Medications   DISCONTD: gabapentin (NEURONTIN) 300 MG capsule    Sig: Take 1 capsule (300 mg total) by mouth 3 (three) times daily.    Dispense:  90 capsule    Refill:  2     Discussed warning signs or symptoms. Please see discharge instructions. Patient expresses understanding.   The above documentation has been reviewed and is accurate and complete Lynne Leader, M.D.

## 2021-06-30 NOTE — Patient Instructions (Addendum)
Good to see you.  Gabapentin- begin taking at night before bed and can increase dosage to 3x/day as needed.  I've ordered a lumbar epidural.  Please call [facility] at [phone] or [phone] to schedule.  Follow-up: as needed

## 2021-07-04 ENCOUNTER — Ambulatory Visit
Admission: RE | Admit: 2021-07-04 | Discharge: 2021-07-04 | Disposition: A | Discharge status: Home / Self Care | Payer: 59 | Source: Ambulatory Visit | Attending: Family Medicine | Admitting: Family Medicine

## 2021-07-04 DIAGNOSIS — M5416 Radiculopathy, lumbar region: Secondary | ICD-10-CM

## 2021-07-04 MED ORDER — METHYLPREDNISOLONE ACETATE 40 MG/ML INJ SUSP (RADIOLOG
80.0000 mg | Freq: Once | INTRAMUSCULAR | Status: AC
  Administered 2021-07-04: 80 mg via EPIDURAL

## 2021-07-04 MED ORDER — IOPAMIDOL (ISOVUE-M 200) INJECTION 41%
1.0000 mL | Freq: Once | INTRAMUSCULAR | Status: AC
  Administered 2021-07-04: 1 mL via EPIDURAL

## 2021-07-04 NOTE — Discharge Instructions (Signed)

## 2021-07-06 ENCOUNTER — Encounter: Payer: Self-pay | Admitting: Family Medicine

## 2021-07-07 ENCOUNTER — Encounter: Payer: Self-pay | Admitting: Family Medicine

## 2021-07-11 ENCOUNTER — Encounter: Payer: Self-pay | Admitting: Family Medicine

## 2021-07-11 NOTE — Progress Notes (Signed)
I, Christoper Fabian, LAT, ATC, am serving as scribe for Dr. Clementeen Graham.  Cameron Hunter is a 51 y.o. male who presents to [facility] at [facility] today for f/u of R knee pain initially thought to be due to lumbar radiculopathy.  Pt was last seen by Dr. Denyse Amass on 06/30/21 c/o severe R knee pain since 06/28/21 that was similar to prior R knee pain in 2019 that was ultimately due to lumbar radiculopathy.  He was referred for a lumbar ESI due to the similarity of his symptoms to his prior symptoms and he had a R L2-3 ESI on 07/04/21.  Today, pt reports no change is his pain since the [facility]. Pt locates pain to the anterior aspect of the R knee. Pt only has pain when weightbearing. Pt has been wearing a knee brace.  Dx imaging: 05/14/18 R knee XR  Pertinent review of systems: No fevers or chills  Relevant historical information: Prior lumbar radiculopathy.  History of gout and knee DJD.   Exam:  BP (!) 152/100    Pulse 94    Ht 6\' 2"  (1.88 m)    Wt (!) 315 lb 6.4 oz (143.1 kg)    SpO2 99%    BMI 40.49 kg/m  General: Well Developed, well nourished, and in no acute distress.   MSK: Right knee no joint effusion normal motion with crepitation.  Mildly tender palpation medial joint line. Stable ligamentous exam. Intact strength.    Lab and Radiology Results  Procedure: Real-time Ultrasound Guided Injection of right knee superior lateral patellar space Device: Philips Affiniti 50G Images permanently stored and available for review in PACS Verbal informed consent obtained.  Discussed risks and benefits of procedure. Warned about infection bleeding damage to structures skin hypopigmentation and fat atrophy among others. Patient expresses understanding and agreement Time-out conducted.   Noted no overlying erythema, induration, or other signs of local infection.   Skin prepped in a sterile fashion.   Local anesthesia: Topical Ethyl chloride.   With sterile technique and under real time  ultrasound guidance: 40 mg of Kenalog and 2 mL of Marcaine injected into knee joint. Fluid seen entering the joint capsule.   Completed without difficulty   Pain moderately resolved suggesting accurate placement of the medication.   Advised to call if fevers/chills, erythema, induration, drainage, or persistent bleeding.   Images permanently stored and available for review in the ultrasound unit.  Impression: Technically successful ultrasound guided injection.   X-ray images L-spine and right knee obtained today personally and independently interpreted.  L-spine: DDD L2-3.  Facet DJD L4-5 L5-S1.  No acute fractures.  Right knee: Mild to moderate medial compartment DJD.  Mild patellofemoral DJD.  No acute fractures.  Await formal radiology review   Description of MRI lumbar 05/27/18: Possibly from [facility].   1.  A transitional lumbosacral vertebra which is called L5 for the purposes of study.  If surgery or any other procedure is contemplated the numbering system that was used for the study should be carefully scrutinized. 2.  Multilevel degenerative changes of the lumbar spine, most prominent at L2-3. 3.  At L2-3, mild to moderate central canal stenosis with probable encroachment on the L3 nerve roots. 4.  At L3-4, a posterior disc osteophyte complex with superimposed small central caudal disc extrusion.  The disc contacts but does not displace the L4 nerve roots. 5.  At L4-5, mild bilateral lateral recess stenosis.  No neural displacement or encroachment. 6.  Probable  chronic lumbar spinous process impingement at L1-2, L2-3, and L3-4.  Recommend clinical correlation.   Assessment and Plan: 51 y.o. male with right knee pain.  Originally pain was thought to be L3 radiculopathy however epidural steroid injection did not help.  This injection was based on his prior experience with similar pain and an old MRI of L-spine from 2019. Since he has effectively had a treatment failure  will think more broadly.  He has knee pain so his knee certainly could be the source of his pain especially given his DJD on x-ray.  Plan to proceed with steroid injection right knee and obtain new knee x-ray.  Additionally we will proceed with further work-up for potential lumbar radiculopathy by obtaining L-spine x-ray and being ready to proceed with updated lumbar MRI to further clarify potential lumbar radiculopathy in this area causing pain.  He will let me know how he feels after the knee injection.  If not better would recommend lumbar spine MRI.   PDMP not reviewed this encounter. Orders Placed This Encounter  Procedures   [place] LIMITED JOINT SPACE STRUCTURES LOW RIGHT(NO LINKED CHARGES)    Standing Status:   Future    Number of Occurrences:   1    Standing Expiration Date:   01/09/2022    Order Specific Question:   Reason for Exam (SYMPTOM  OR DIAGNOSIS REQUIRED)    Answer:   right knee pain    Order Specific Question:   Preferred imaging location?    Answer:   [facility] Knee AP/LAT W/[place] Right    Standing Status:   Future    Number of Occurrences:   1    Standing Expiration Date:   07/12/2022    Order Specific Question:   Reason for Exam (SYMPTOM  OR DIAGNOSIS REQUIRED)    Answer:   right knee pain    Order Specific Question:   Preferred imaging location?    Answer:   [facility]   DG Lumbar Spine 2-3 Views    Standing Status:   Future    Number of Occurrences:   1    Standing Expiration Date:   07/12/2022    Order Specific Question:   Reason for Exam (SYMPTOM  OR DIAGNOSIS REQUIRED)    Answer:   lumbar radiculopathy    Order Specific Question:   Preferred imaging location?    Answer:   [facility]   No orders of the defined types were placed in this encounter.    Discussed warning signs or symptoms. Please see discharge instructions. Patient expresses understanding.   The above documentation has been reviewed and is  accurate and complete Clementeen Graham, M.D.

## 2021-07-12 ENCOUNTER — Other Ambulatory Visit: Payer: Self-pay

## 2021-07-12 ENCOUNTER — Ambulatory Visit: Payer: Self-pay

## 2021-07-12 ENCOUNTER — Ambulatory Visit (INDEPENDENT_AMBULATORY_CARE_PROVIDER_SITE_OTHER): Payer: 59

## 2021-07-12 ENCOUNTER — Ambulatory Visit: Payer: 59 | Admitting: Family Medicine

## 2021-07-12 VITALS — BP 152/100 | HR 94 | Ht 74.0 in | Wt 315.4 lb

## 2021-07-12 DIAGNOSIS — M25561 Pain in right knee: Secondary | ICD-10-CM

## 2021-07-12 DIAGNOSIS — M5416 Radiculopathy, lumbar region: Secondary | ICD-10-CM

## 2021-07-12 NOTE — Patient Instructions (Addendum)
Thank you for coming in today.   Please get an Xray today before you leave   You received an injection today. Seek immediate medical attention if the joint becomes red, extremely painful, or is oozing fluid.   Send me a MyChart message is a couple days and let me know how you are doing. (Around Monday)  If not improving, can proceed for more advanced imaging.

## 2021-07-13 NOTE — Progress Notes (Signed)
Right knee x-ray shows some arthritis changes

## 2021-07-14 ENCOUNTER — Encounter: Payer: Self-pay | Admitting: Family Medicine

## 2021-07-14 DIAGNOSIS — M5416 Radiculopathy, lumbar region: Secondary | ICD-10-CM

## 2021-07-14 DIAGNOSIS — M25561 Pain in right knee: Secondary | ICD-10-CM

## 2021-07-14 NOTE — Progress Notes (Signed)
Lumbar spine x-ray shows some arthritis changes in the low back.

## 2021-07-16 ENCOUNTER — Encounter: Payer: Self-pay | Admitting: Family Medicine

## 2021-07-17 ENCOUNTER — Encounter: Payer: Self-pay | Admitting: Physical Therapy

## 2021-07-17 NOTE — Progress Notes (Deleted)
° °  I, Christoper Fabian, LAT, ATC, am serving as scribe for Dr. Clementeen Graham.  Cameron Hunter is a 51 y.o. male who presents to [facility] at [facility] today for f/u of L wrist pain and R knee pain.  He was last seen by Dr. Denyse Amass on 07/12/21 for f/u of his R knee pain that was originally thought to be due to lumbar radic but was not relieved by a R L2-3 ESI that he had on 07/04/21.  He had a R knee steroid injection at his last visit.  Prior to that, he was seen by Dr. Denyse Amass on 06/20/21 for his L wrist pain due to either a gout flare and/or ECU tendinitis.  He was shown a HEP and later prescribed allopurinol due to an elevated uric acid level.  Today, pt reports   Diagnostic testing: L-spine and R knee XR- 07/12/21; L wrist XR- 06/20/21  Pertinent review of systems: ***  Relevant historical information: ***   Exam:  There were no vitals taken for this visit. General: Well Developed, well nourished, and in no acute distress.   MSK: ***    Lab and Radiology Results No results found for this or any previous visit (from the past 72 hour(s)). No results found.     Assessment and Plan: 51 y.o. male with ***   PDMP not reviewed this encounter. No orders of the defined types were placed in this encounter.  No orders of the defined types were placed in this encounter.    Discussed warning signs or symptoms. Please see discharge instructions. Patient expresses understanding.   ***

## 2021-07-18 ENCOUNTER — Other Ambulatory Visit: Payer: Self-pay | Admitting: Internal Medicine

## 2021-07-18 ENCOUNTER — Ambulatory Visit: Payer: 59 | Admitting: Family Medicine

## 2021-07-23 ENCOUNTER — Encounter: Payer: Self-pay | Admitting: Family Medicine

## 2021-07-24 ENCOUNTER — Encounter: Payer: Self-pay | Admitting: Family Medicine

## 2021-07-25 ENCOUNTER — Encounter: Payer: Self-pay | Admitting: Family Medicine

## 2021-07-31 ENCOUNTER — Other Ambulatory Visit: Payer: Self-pay

## 2021-07-31 ENCOUNTER — Ambulatory Visit (INDEPENDENT_AMBULATORY_CARE_PROVIDER_SITE_OTHER): Payer: 59

## 2021-07-31 DIAGNOSIS — M5416 Radiculopathy, lumbar region: Secondary | ICD-10-CM

## 2021-07-31 DIAGNOSIS — M25561 Pain in right knee: Secondary | ICD-10-CM

## 2021-08-01 ENCOUNTER — Encounter: Payer: Self-pay | Admitting: Family Medicine

## 2021-08-01 DIAGNOSIS — M5416 Radiculopathy, lumbar region: Secondary | ICD-10-CM

## 2021-08-01 NOTE — Progress Notes (Signed)
MRI of the lumbar spine does show potential for pinched nerve at L3-4 worse on the right.  I think this very likely could explain your pain.  This is 1 level below the level that was injected in January. Would you like me to order an injection now?

## 2021-08-03 ENCOUNTER — Ambulatory Visit
Admission: RE | Admit: 2021-08-03 | Discharge: 2021-08-03 | Disposition: A | Discharge status: Home / Self Care | Payer: 59 | Source: Ambulatory Visit | Attending: Family Medicine | Admitting: Family Medicine

## 2021-08-03 ENCOUNTER — Other Ambulatory Visit: Payer: Self-pay

## 2021-08-03 ENCOUNTER — Other Ambulatory Visit: Payer: Self-pay | Admitting: Family Medicine

## 2021-08-03 DIAGNOSIS — M5416 Radiculopathy, lumbar region: Secondary | ICD-10-CM

## 2021-08-03 MED ORDER — IOPAMIDOL (ISOVUE-M 200) INJECTION 41%
1.0000 mL | Freq: Once | INTRAMUSCULAR | Status: AC
  Administered 2021-08-03: 1 mL via EPIDURAL

## 2021-08-03 MED ORDER — METHYLPREDNISOLONE ACETATE 40 MG/ML INJ SUSP (RADIOLOG
80.0000 mg | Freq: Once | INTRAMUSCULAR | Status: AC
  Administered 2021-08-03: 80 mg via EPIDURAL

## 2021-08-03 NOTE — Discharge Instructions (Signed)

## 2021-08-07 ENCOUNTER — Encounter: Payer: Self-pay | Admitting: Family Medicine

## 2021-08-08 ENCOUNTER — Encounter: Payer: Self-pay | Admitting: Family Medicine

## 2021-08-10 MED ORDER — COLCHICINE 0.6 MG PO TABS: 0.6000 mg | ORAL_TABLET | Freq: Two times a day (BID) | ORAL | 2 refills | Status: DC

## 2021-08-17 ENCOUNTER — Encounter: Payer: Self-pay | Admitting: Family Medicine

## 2021-08-17 DIAGNOSIS — M25561 Pain in right knee: Secondary | ICD-10-CM

## 2021-08-27 ENCOUNTER — Other Ambulatory Visit: Payer: Self-pay

## 2021-08-27 ENCOUNTER — Ambulatory Visit (INDEPENDENT_AMBULATORY_CARE_PROVIDER_SITE_OTHER): Payer: 59

## 2021-08-27 DIAGNOSIS — M25561 Pain in right knee: Secondary | ICD-10-CM

## 2021-08-28 NOTE — Progress Notes (Signed)
Knee MRI shows areas of significant arthritis almost like potholes especially under the kneecap.  ?No mensicus or ligament tear.  ?You already had an injection for the knee.  ?Would you like me to refer you orthopedic surgery to discuss your options

## 2021-08-29 ENCOUNTER — Encounter: Payer: Self-pay | Admitting: Family Medicine

## 2021-08-29 ENCOUNTER — Other Ambulatory Visit: Payer: Self-pay | Admitting: Physical Therapy

## 2021-08-29 DIAGNOSIS — M25561 Pain in right knee: Secondary | ICD-10-CM

## 2021-08-31 ENCOUNTER — Encounter: Payer: Self-pay | Admitting: Family Medicine

## 2021-09-04 ENCOUNTER — Encounter: Payer: Self-pay | Admitting: Family Medicine

## 2021-09-04 ENCOUNTER — Ambulatory Visit (INDEPENDENT_AMBULATORY_CARE_PROVIDER_SITE_OTHER): Payer: 59

## 2021-09-04 ENCOUNTER — Ambulatory Visit: Payer: 59 | Admitting: Orthopedic Surgery

## 2021-09-04 ENCOUNTER — Other Ambulatory Visit: Payer: Self-pay

## 2021-09-04 DIAGNOSIS — M25551 Pain in right hip: Secondary | ICD-10-CM | POA: Diagnosis not present

## 2021-09-07 ENCOUNTER — Encounter: Payer: Self-pay | Admitting: Orthopedic Surgery

## 2021-09-07 NOTE — Progress Notes (Signed)
? ?Office Visit Note ?  ?Patient: Cameron Hunter           ?Date of Birth: 12-19-1970           ?MRN: [id number] ?Visit Date: 09/04/2021 ?Requested by: Rodolph Bong, MD ?[address] ?[place],  [place] [address] ?PCP: Corky Downs, MD ? ?Subjective: ?Chief Complaint  ?Patient presents with  ? Right Knee - Pain  ? ? ?HPI: Cameron Hunter is a 51 year old patient with right knee pain for 2 months.  No prior surgery.  He did get some injections in January with no relief.  Reports a lot of pain with weightbearing.  He works as a Web designer and takes about 15,000 steps a day.  He does describe a history in November 2019 of having similar pain which was severe but he had lumbar spine ESI with very good relief at that time.  In January pain recurred and he did have a knee injection as well as lumbar spine ESI did not receive any relief from that.  Tried colchicine without relief.  Reports primarily anterior pain as well as anterior thigh pain.  No groin pain and no significant low back pain.  Difficult for him to get into low cars and to bend forward. ? ?MRI scan of the right knee is reviewed with the patient.  Generally speaking there is mild chondromalacia in the medial compartment less so in the lateral compartment.  Worse where is in the patellofemoral compartment.  No meniscal damage present.  MRI scan of the lumbar spine demonstrates disc bulge eccentric to the right at L1-2.  Also disc bulge at L3-4 slightly eccentric to the right. ? ?Patient also states that laying on his stomach is pretty painful for him as well.  Hurts for him to strain as well and this pain localizes to the right thigh and knee region. ?             ?ROS: All systems reviewed are negative as they relate to the chief complaint within the history of present illness.  Patient denies  fevers or chills. ? ? ?Assessment & Plan: ?Visit Diagnoses:  ?1. Pain in right hip   ? ? ?Plan: Impression is right knee pain with some patellofemoral  chondromalacia but overall the MRI scan of the knee is unimpressive and he has had injection in the knee with no relief.  Based on his pain with being prone as well as pain with straining such as when he has a bowel movement I think this likely represents far lateral disc herniation on the right-hand side which is only really affecting the nerve root.  He wants to try gel injection through the flex agenic's which the has already had approved.  He will call me after that injection if it does not help then we can proceed with further back work-up at that time which would be CT myelogram of the lumbar spine.  Could also consider different type of lumbar spine ESI with Dr. Alvester Morin.  Look forward to hearing from him about a week after his gel injection in the knee. ? ?Follow-Up Instructions: Return if symptoms worsen or fail to improve.  ? ?Orders:  ?Orders Placed This Encounter  ?Procedures  ? XR HIP UNILAT W OR W/O PELVIS 2-3 VIEWS RIGHT  ? ?No orders of the defined types were placed in this encounter. ? ? ? ? Procedures: ?No procedures performed ? ? ?Clinical Data: ?No additional findings. ? ?Objective: ?Vital Signs: There  were no vitals taken for this visit. ? ?Physical Exam:  ? ?Constitutional: Patient appears well-developed ?HEENT:  ?Head: Normocephalic ?Eyes:EOM are normal ?Neck: Normal range of motion ?Cardiovascular: Normal rate ?Pulmonary/chest: Effort normal ?Neurologic: Patient is alert ?Skin: Skin is warm ?Psychiatric: Patient has normal mood and affect ? ? ?Ortho Exam: Ortho exam demonstrates 5 out of 5 ankle dorsiflexion plantarflexion quad hamstring strength with no real groin pain with internal/external rotation of either leg.  No knee effusion.  Not too much in the way of joint line tenderness medially or laterally on the right.  Range of motion is full in the knee and hip.  Mild pain with hyperextension less so with forward flexion.  No trochanteric tenderness is present.  No other masses  lymphadenopathy or skin changes noted in that right knee region. ? ?Specialty Comments:  ?No specialty comments available. ? ?Imaging: ?No results found. ? ? ?PMFS History: ?Patient Active Problem List  ? Diagnosis Date Noted  ? Class 2 obesity due to excess calories without serious comorbidity with body mass index (BMI) of 38.0 to 38.9 in adult 01/04/2021  ? Lumbago of lumbar region with sciatica 01/04/2021  ? Primary hypertension 01/04/2021  ? Knee pain   ? ?Past Medical History:  ?Diagnosis Date  ? Gout   ? Knee pain   ? Right knee   ?  ?Family History  ?Problem Relation Age of Onset  ? Lung cancer Mother   ? Diabetes Mother   ? COPD Mother   ? Heart disease Mother   ? Kidney disease Mother   ?  ?History reviewed. No pertinent surgical history. ?Social History  ? ?Occupational History  ? Not on file  ?Tobacco Use  ? Smoking status: Never  ? Smokeless tobacco: Never  ?Vaping Use  ? Vaping Use: Never used  ?Substance and Sexual Activity  ? Alcohol use: Not Currently  ? Drug use: Never  ? Sexual activity: Not on file  ? ? ? ? ? ?

## 2021-09-08 ENCOUNTER — Encounter: Payer: Self-pay | Admitting: Family Medicine

## 2021-09-12 ENCOUNTER — Encounter: Payer: Self-pay | Admitting: Family Medicine

## 2021-09-13 ENCOUNTER — Encounter: Payer: Self-pay | Admitting: Orthopedic Surgery

## 2021-09-13 ENCOUNTER — Other Ambulatory Visit: Payer: Self-pay | Admitting: Internal Medicine

## 2021-09-15 ENCOUNTER — Other Ambulatory Visit: Payer: Self-pay

## 2021-09-15 DIAGNOSIS — M545 Low back pain, unspecified: Secondary | ICD-10-CM

## 2021-09-18 ENCOUNTER — Other Ambulatory Visit: Payer: Self-pay | Admitting: *Deleted

## 2021-09-18 ENCOUNTER — Telehealth: Payer: Self-pay | Admitting: Orthopedic Surgery

## 2021-09-18 ENCOUNTER — Other Ambulatory Visit: Payer: Self-pay | Admitting: Internal Medicine

## 2021-09-18 MED ORDER — PREGABALIN 50 MG PO CAPS: ORAL_CAPSULE | ORAL | 1 refills | Status: DC

## 2021-09-18 NOTE — Telephone Encounter (Signed)
Called pt 1X and left vm for pt to call and set MRI revew with Dr. August Saucer after 4/5 ?

## 2021-09-18 NOTE — Telephone Encounter (Signed)
Pt called requesting to have MRI results read over the phone. Pt states he don't want to wait until 4/19 for next available appt. Pt MRI is scheduled for 4/5. Pt phone number is [phone]. ?

## 2021-09-19 ENCOUNTER — Ambulatory Visit
Admission: RE | Admit: 2021-09-19 | Discharge: 2021-09-19 | Disposition: A | Discharge status: Home / Self Care | Payer: 59 | Source: Ambulatory Visit | Attending: Orthopedic Surgery | Admitting: Orthopedic Surgery

## 2021-09-19 ENCOUNTER — Other Ambulatory Visit: Payer: Self-pay | Admitting: Internal Medicine

## 2021-09-19 DIAGNOSIS — M545 Low back pain, unspecified: Secondary | ICD-10-CM

## 2021-09-19 MED ORDER — ONDANSETRON HCL 4 MG/2ML IJ SOLN
4.0000 mg | Freq: Once | INTRAMUSCULAR | Status: AC | PRN
  Administered 2021-09-19: 4 mg via INTRAMUSCULAR

## 2021-09-19 MED ORDER — IOPAMIDOL (ISOVUE-M 200) INJECTION 41%: 18.0000 mL | Freq: Once | INTRAMUSCULAR | Status: DC

## 2021-09-19 MED ORDER — DIAZEPAM 5 MG PO TABS
10.0000 mg | ORAL_TABLET | Freq: Once | ORAL | Status: AC
  Administered 2021-09-19: 10 mg via ORAL

## 2021-09-19 MED ORDER — MEPERIDINE HCL 50 MG/ML IJ SOLN
50.0000 mg | Freq: Once | INTRAMUSCULAR | Status: AC | PRN
  Administered 2021-09-19: 50 mg via INTRAMUSCULAR

## 2021-09-19 MED ORDER — IOPAMIDOL (ISOVUE-M 200) INJECTION 41%
10.0000 mL | Freq: Once | INTRAMUSCULAR | Status: AC | PRN
  Administered 2021-09-19: 10 mL via INTRATHECAL

## 2021-09-19 NOTE — Discharge Instructions (Signed)

## 2021-09-19 NOTE — Discharge Instr - Other Orders (Addendum)
1100: Pain 9/10 post myelogram procedure, see MAR.  ?1118: pt reports his pain is now a 5/10. Pt in nursing recovery area until d/c time.  ?

## 2021-09-21 ENCOUNTER — Encounter: Payer: Self-pay | Admitting: Orthopedic Surgery

## 2021-09-21 ENCOUNTER — Other Ambulatory Visit: Payer: Self-pay | Admitting: Internal Medicine

## 2021-09-21 ENCOUNTER — Encounter: Payer: Self-pay | Admitting: Family Medicine

## 2021-09-21 NOTE — Telephone Encounter (Signed)
I called left message on machine

## 2021-09-25 ENCOUNTER — Encounter: Payer: Self-pay | Admitting: Internal Medicine

## 2021-09-25 ENCOUNTER — Encounter: Payer: Self-pay | Admitting: Orthopedic Surgery

## 2021-09-26 NOTE — Telephone Encounter (Signed)
Sure okay for ESI at [facility] thanks

## 2021-09-27 ENCOUNTER — Encounter: Payer: Self-pay | Admitting: Orthopedic Surgery

## 2021-09-27 ENCOUNTER — Encounter: Payer: Self-pay | Admitting: Family Medicine

## 2021-09-28 ENCOUNTER — Telehealth: Payer: Self-pay | Admitting: Orthopedic Surgery

## 2021-09-28 ENCOUNTER — Other Ambulatory Visit: Payer: Self-pay

## 2021-09-28 ENCOUNTER — Telehealth: Payer: Self-pay

## 2021-09-28 DIAGNOSIS — M545 Low back pain, unspecified: Secondary | ICD-10-CM

## 2021-09-28 NOTE — Telephone Encounter (Signed)
Patient called asked for a call back concerning the conversations via mychart.  With Lauren. Patient is also inquiring about the injections and what happens if the injection does not work. ? Patient said he has been going back and forth and Lauren who is trying to help him. ? The number to contact patient is [phone] ?

## 2021-09-28 NOTE — Telephone Encounter (Signed)
I have talked with patient at length. Patient states he is absolutely miserable with his leg pain. Wanted to know what could be done to help him control his pain until we can get him in with neurosurgery. He complains of intense leg pain, unable to walk more than 6 steps without severe pain, not able to work, not sleeping, etc. I have placed urgent referral for neurosurgery.  ?

## 2021-09-28 NOTE — Telephone Encounter (Signed)
thx

## 2021-09-29 ENCOUNTER — Other Ambulatory Visit: Payer: Self-pay | Admitting: Orthopedic Surgery

## 2021-09-29 MED ORDER — GABAPENTIN 100 MG PO CAPS: 100.0000 mg | ORAL_CAPSULE | Freq: Three times a day (TID) | ORAL | 0 refills | Status: DC

## 2021-09-29 NOTE — Telephone Encounter (Signed)
I called.  His pain is getting somewhat worse but it definitely sounds like it is coming from that L3 nerve root irritation on the right-hand side.  We will try him on Neurontin until his appointment with neurosurgery on Wednesday.

## 2021-10-02 MED ORDER — HYDROCODONE-ACETAMINOPHEN 5-325 MG PO TABS: 1.0000 | ORAL_TABLET | Freq: Four times a day (QID) | ORAL | 0 refills | Status: DC | PRN

## 2021-10-09 ENCOUNTER — Encounter: Payer: Self-pay | Admitting: Family Medicine

## 2021-10-10 ENCOUNTER — Encounter: Payer: Self-pay | Admitting: Family Medicine

## 2021-10-16 HISTORY — PX: BACK SURGERY: SHX140

## 2021-10-19 ENCOUNTER — Other Ambulatory Visit: Payer: Self-pay | Admitting: Orthopedic Surgery

## 2021-11-03 ENCOUNTER — Other Ambulatory Visit: Payer: Self-pay | Admitting: Family Medicine

## 2021-11-07 ENCOUNTER — Other Ambulatory Visit: Payer: Self-pay | Admitting: Family Medicine

## 2021-11-16 DIAGNOSIS — Z0289 Encounter for other administrative examinations: Secondary | ICD-10-CM

## 2021-11-17 ENCOUNTER — Other Ambulatory Visit: Payer: Self-pay | Admitting: Internal Medicine

## 2021-11-21 ENCOUNTER — Other Ambulatory Visit: Payer: Self-pay | Admitting: Internal Medicine

## 2021-11-21 ENCOUNTER — Ambulatory Visit (INDEPENDENT_AMBULATORY_CARE_PROVIDER_SITE_OTHER): Payer: 59 | Admitting: Bariatrics

## 2021-11-21 ENCOUNTER — Encounter (INDEPENDENT_AMBULATORY_CARE_PROVIDER_SITE_OTHER): Payer: Self-pay | Admitting: Bariatrics

## 2021-11-21 VITALS — BP 153/91 | HR 100 | Temp 97.6°F | Ht 73.0 in | Wt 323.0 lb

## 2021-11-21 DIAGNOSIS — Z1331 Encounter for screening for depression: Secondary | ICD-10-CM | POA: Diagnosis not present

## 2021-11-21 DIAGNOSIS — M1990 Unspecified osteoarthritis, unspecified site: Secondary | ICD-10-CM | POA: Insufficient documentation

## 2021-11-21 DIAGNOSIS — R7309 Other abnormal glucose: Secondary | ICD-10-CM | POA: Diagnosis not present

## 2021-11-21 DIAGNOSIS — I10 Essential (primary) hypertension: Secondary | ICD-10-CM

## 2021-11-21 DIAGNOSIS — M109 Gout, unspecified: Secondary | ICD-10-CM

## 2021-11-21 DIAGNOSIS — E559 Vitamin D deficiency, unspecified: Secondary | ICD-10-CM | POA: Diagnosis not present

## 2021-11-21 DIAGNOSIS — E1169 Type 2 diabetes mellitus with other specified complication: Secondary | ICD-10-CM | POA: Insufficient documentation

## 2021-11-21 DIAGNOSIS — R0602 Shortness of breath: Secondary | ICD-10-CM | POA: Insufficient documentation

## 2021-11-21 DIAGNOSIS — E668 Other obesity: Secondary | ICD-10-CM

## 2021-11-21 DIAGNOSIS — R5383 Other fatigue: Secondary | ICD-10-CM | POA: Insufficient documentation

## 2021-11-21 DIAGNOSIS — E669 Obesity, unspecified: Secondary | ICD-10-CM | POA: Insufficient documentation

## 2021-11-21 DIAGNOSIS — Z0000 Encounter for general adult medical examination without abnormal findings: Secondary | ICD-10-CM | POA: Insufficient documentation

## 2021-11-21 DIAGNOSIS — Z6841 Body Mass Index (BMI) 40.0 and over, adult: Secondary | ICD-10-CM

## 2021-11-22 ENCOUNTER — Encounter (INDEPENDENT_AMBULATORY_CARE_PROVIDER_SITE_OTHER): Payer: Self-pay | Admitting: Bariatrics

## 2021-11-22 DIAGNOSIS — R748 Abnormal levels of other serum enzymes: Secondary | ICD-10-CM | POA: Insufficient documentation

## 2021-11-22 LAB — HEMOGLOBIN A1C
Est. average glucose Bld gHb Est-mCnc: 160 mg/dL
Hgb A1c MFr Bld: 7.2 % — ABNORMAL HIGH (ref 4.8–5.6)

## 2021-11-22 LAB — COMPREHENSIVE METABOLIC PANEL
ALT: 81 IU/L — ABNORMAL HIGH (ref 0–44)
AST: 53 IU/L — ABNORMAL HIGH (ref 0–40)
Albumin/Globulin Ratio: 1.8 (ref 1.2–2.2)
Albumin: 5 g/dL — ABNORMAL HIGH (ref 3.8–4.9)
Alkaline Phosphatase: 62 IU/L (ref 44–121)
BUN/Creatinine Ratio: 14 (ref 9–20)
BUN: 16 mg/dL (ref 6–24)
Bilirubin Total: 0.6 mg/dL (ref 0.0–1.2)
CO2: 22 mmol/L (ref 20–29)
Calcium: 9.8 mg/dL (ref 8.7–10.2)
Chloride: 98 mmol/L (ref 96–106)
Creatinine, Ser: 1.12 mg/dL (ref 0.76–1.27)
Globulin, Total: 2.8 g/dL (ref 1.5–4.5)
Glucose: 148 mg/dL — ABNORMAL HIGH (ref 70–99)
Potassium: 4.6 mmol/L (ref 3.5–5.2)
Sodium: 135 mmol/L (ref 134–144)
Total Protein: 7.8 g/dL (ref 6.0–8.5)
eGFR: 79 mL/min/{1.73_m2} (ref >59)

## 2021-11-22 LAB — LIPID PANEL WITH LDL/HDL RATIO
Cholesterol, Total: 238 mg/dL — ABNORMAL HIGH (ref 100–199)
HDL: 48 mg/dL (ref >39)
LDL Chol Calc (NIH): 157 mg/dL — ABNORMAL HIGH (ref 0–99)
LDL/HDL Ratio: 3.3 ratio (ref 0.0–3.6)
Triglycerides: 181 mg/dL — ABNORMAL HIGH (ref 0–149)
VLDL Cholesterol Cal: 33 mg/dL (ref 5–40)

## 2021-11-22 LAB — VITAMIN D 25 HYDROXY (VIT D DEFICIENCY, FRACTURES): Vit D, 25-Hydroxy: 17.5 ng/mL — ABNORMAL LOW (ref 30.0–100.0)

## 2021-11-22 LAB — TSH+T4F+T3FREE
Free T4: 1.46 ng/dL (ref 0.82–1.77)
T3, Free: 3.9 pg/mL (ref 2.0–4.4)
TSH: 1.56 u[IU]/mL (ref 0.450–4.500)

## 2021-11-22 LAB — INSULIN, RANDOM: INSULIN: 37.9 u[IU]/mL — ABNORMAL HIGH (ref 2.6–24.9)

## 2021-11-22 NOTE — Progress Notes (Signed)
Chief Complaint:   OBESITY Cameron Hunter (MR# [id number]) is a 51 y.o. male who presents for evaluation and treatment of obesity and related comorbidities. Current BMI is Body mass index is 42.61 kg/m. Cameron Hunter has been struggling with his weight for many years and has been unsuccessful in either losing weight, maintaining weight loss, or reaching his healthy weight goal.  Cameron Hunter states that he struggles with snacking. He states that he likes to cook. He states that he skips breakfast most days. His goal is to get to 250 lbs.   Cameron Hunter is currently in the action stage of change and ready to dedicate time achieving and maintaining a healthier weight. Cameron Hunter is interested in becoming our patient and working on intensive lifestyle modifications including (but not limited to) diet and exercise for weight loss.  Cameron Hunter's habits were reviewed today and are as follows: His family eats meals together, he thinks his family will eat healthier with him, his desired weight loss is 73 pounds, he has been heavy most of his life, he started gaining weight been most of his life, his heaviest weight ever was 400 pounds, he has significant food cravings issues, he snacks frequently in the evenings, he skips meals frequently, he is frequently drinking liquids with calories, and he struggles with emotional eating.  Depression Screen Cameron Hunter's Food and Mood (modified PHQ-9) score was 7.     11/21/2021    2:15 PM  Depression screen PHQ 2/9  Decreased Interest 1  Down, Depressed, Hopeless 1  PHQ - 2 Score 2  Altered sleeping 1  Tired, decreased energy 3  Change in appetite 0  Feeling bad or failure about yourself  1  Trouble concentrating 0  Moving slowly or fidgety/restless 0  Suicidal thoughts 0  PHQ-9 Score 7  Difficult doing work/chores Not difficult at all   Subjective:   1. Other fatigue Cameron Hunter admits to daytime somnolence and admits to waking up still tired. Patient has a history of symptoms of morning  fatigue. Cameron Hunter generally gets 7 or 8 hours of sleep per night, and states that he has difficulty falling asleep and generally restful sleep. Snoring is present. Apneic episodes are not present. Epworth Sleepiness Score is 4.  Cameron Hunter's RMR expected 2892. His actual RMR today was 3139.   2. SOB (shortness of breath) on exertion Cameron Hunter notes increasing shortness of breath with exercising and seems to be worsening over time with weight gain. He notes getting out of breath sooner with activity than he used to. This has not gotten worse recently. Cameron Hunter denies shortness of breath at rest or orthopnea.   3. Essential hypertension Cameron Hunter is currently taking amlodipine and  lisinopril. He has taken his blood pressure medication today. His blood pressure is high today 153/91.  4. Gout, unspecified cause, unspecified chronicity, unspecified site Cameron Hunter is taking allopurinol and colchicine currently. He notes gout in multiple sites.   5. Elevated glucose Cameron Hunter is not medications currently.   6. Healthcare maintenance Cameron Hunter last had labs 06/2021.  7. Vitamin D deficiency Cameron Hunter is not on medications currently.   Assessment/Plan:   1. Other fatigue Cameron Hunter does feel that his weight is causing his energy to be lower than it should be. Fatigue may be related to obesity, depression or many other causes. Labs will be ordered, and in the meanwhile, Cameron Hunter will focus on self care including making healthy food choices, increasing physical activity and focusing on stress reduction. We discussed indirect calolemetry and RMR in detail.  He will slowly increase activities. We reviewed EKG today. We will check TSH today.   - EKG 12-Lead - TSH+T4F+T3Free   2. SOB (shortness of breath) on exertion Cameron Hunter does feel that he gets out of breath more easily that he used to when he exercises. Cameron Hunter's shortness of breath appears to be obesity related and exercise induced. He has agreed to work on weight loss and gradually increase  exercise to treat his exercise induced shortness of breath. Will continue to monitor closely.   - TSH+T4F+T3Free  3. Essential hypertension Cameron Hunter will continue his medications. We will check CMP and lipid panel today. He is working on healthy weight loss and exercise to improve blood pressure control. We will watch for signs of hypotension as he continues his lifestyle modifications.  - Lipid Panel With LDL/HDL Ratio - Comprehensive metabolic panel  4. Gout, unspecified cause, unspecified chronicity, unspecified site Cameron Hunter will continue his medications. He will avoid triggers.   5. Elevated glucose We will check A1C and insulin today.   - Insulin, random - Hemoglobin A1c  6. Healthcare maintenance We will check Hgb A1C, insulin, lipid panel, TSH, T4, T3 free, Vitamin D, and CMP today.   - Lipid Panel With LDL/HDL Ratio - Comprehensive metabolic panel - [id number] - Insulin, random - Hemoglobin A1c - VITAMIN D 25 Hydroxy (Vit-D Deficiency, Fractures)  7. Vitamin D deficiency Low Vitamin D level contributes to fatigue and are associated with obesity, breast, and colon cancer. We will check Vitamin D today and Cameron Hunter will follow-up for routine testing of Vitamin D, at least 2-3 times per year to avoid over-replacement.  - VITAMIN D 25 Hydroxy (Vit-D Deficiency, Fractures)  8. Depression screening Cameron Hunter had a positive depression screening. Depression is commonly associated with obesity and often results in emotional eating behaviors. We will monitor this closely and work on CBT to help improve the non-hunger eating patterns. Referral to Psychology may be required if no improvement is seen as he continues in our clinic.   9. Class 3 severe obesity with serious comorbidity and body mass index (BMI) of 40.0 to 44.9 in adult, unspecified obesity type Cameron Hunter) Cameron Hunter is currently in the action stage of change and his goal is to continue with weight loss efforts. I recommend Cameron Hunter begin  the structured treatment plan as follows:  He has agreed to the Category 4 Plan.  Cameron Hunter was provided Better Snacking Options today. He will continue meal planning. We reviewed labs from 06/20/2021 CMP, glucose, and Sed rate.   Exercise goals:  Cameron Hunter is using a recumbent bike, water aerobics, and walking.      Behavioral modification strategies: increasing lean protein intake, decreasing simple carbohydrates, increasing vegetables, increasing water intake, decreasing eating out, no skipping meals, meal planning and cooking strategies, keeping healthy foods in the home, and planning for success.  He was informed of the importance of frequent follow-up visits to maximize his success with intensive lifestyle modifications for his multiple health conditions. He was informed we would discuss his lab results at his next visit unless there is a critical issue that needs to be addressed sooner. Cameron Hunter agreed to keep his next visit at the agreed upon time to discuss these results.  Objective:   Blood pressure (!) 153/91, pulse 100, temperature 97.6 F (36.4 C), height 6\' 1"  (1.854 m), weight (!) 323 lb (146.5 kg), SpO2 95 %. Body mass index is 42.61 kg/m.  EKG: Normal sinus rhythm, rate 101 bpm.  Indirect Calorimeter completed today shows  a VO2 of 455 and a REE of 3139.  His calculated basal metabolic rate is 123456 thus his basal metabolic rate is better than expected.  General: Cooperative, alert, well developed, in no acute distress. HEENT: Conjunctivae and lids unremarkable. Cardiovascular: Regular rhythm.  Lungs: Normal work of breathing. Neurologic: No focal deficits.   Lab Results  Component Value Date   CREATININE 1.12 11/21/2021   BUN 16 11/21/2021   NA 135 11/21/2021   K 4.6 11/21/2021   CL 98 11/21/2021   CO2 22 11/21/2021   Lab Results  Component Value Date   ALT 81 (H) 11/21/2021   AST 53 (H) 11/21/2021   ALKPHOS 62 11/21/2021   BILITOT 0.6 11/21/2021   Lab Results   Component Value Date   HGBA1C 7.2 (H) 11/21/2021   Lab Results  Component Value Date   INSULIN 37.9 (H) 11/21/2021   Lab Results  Component Value Date   TSH 1.560 11/21/2021   Lab Results  Component Value Date   CHOL 238 (H) 11/21/2021   HDL 48 11/21/2021   LDLCALC 157 (H) 11/21/2021   TRIG 181 (H) 11/21/2021   No results found for: WBC, HGB, HCT, MCV, PLT No results found for: IRON, TIBC, FERRITIN  Attestation Statements:   Reviewed by clinician on day of visit: allergies, medications, problem list, medical history, surgical history, family history, social history, and previous encounter notes.  I, Lizbeth Bark, RMA, am acting as Location manager for [employer], DO.  I have reviewed the above documentation for accuracy and completeness, and I agree with the above. Jearld Lesch, DO

## 2021-11-22 NOTE — Progress Notes (Signed)
Notified patient of Dr. Brown's recommendations. Patient verbalized understanding. 

## 2021-11-27 ENCOUNTER — Encounter (INDEPENDENT_AMBULATORY_CARE_PROVIDER_SITE_OTHER): Payer: Self-pay | Admitting: Bariatrics

## 2021-12-05 ENCOUNTER — Encounter (INDEPENDENT_AMBULATORY_CARE_PROVIDER_SITE_OTHER): Payer: Self-pay | Admitting: Bariatrics

## 2021-12-05 ENCOUNTER — Ambulatory Visit (INDEPENDENT_AMBULATORY_CARE_PROVIDER_SITE_OTHER): Payer: 59 | Admitting: Bariatrics

## 2021-12-05 ENCOUNTER — Other Ambulatory Visit: Payer: Self-pay | Admitting: Internal Medicine

## 2021-12-05 VITALS — BP 151/89 | HR 96 | Temp 97.6°F | Ht 73.0 in | Wt 321.0 lb

## 2021-12-05 DIAGNOSIS — Z6841 Body Mass Index (BMI) 40.0 and over, adult: Secondary | ICD-10-CM

## 2021-12-05 DIAGNOSIS — E7800 Pure hypercholesterolemia, unspecified: Secondary | ICD-10-CM | POA: Diagnosis not present

## 2021-12-05 DIAGNOSIS — E559 Vitamin D deficiency, unspecified: Secondary | ICD-10-CM | POA: Diagnosis not present

## 2021-12-05 DIAGNOSIS — R748 Abnormal levels of other serum enzymes: Secondary | ICD-10-CM | POA: Diagnosis not present

## 2021-12-05 DIAGNOSIS — E1169 Type 2 diabetes mellitus with other specified complication: Secondary | ICD-10-CM | POA: Diagnosis not present

## 2021-12-05 DIAGNOSIS — Z7985 Long-term (current) use of injectable non-insulin antidiabetic drugs: Secondary | ICD-10-CM

## 2021-12-05 DIAGNOSIS — E669 Obesity, unspecified: Secondary | ICD-10-CM

## 2021-12-05 DIAGNOSIS — M5440 Lumbago with sciatica, unspecified side: Secondary | ICD-10-CM

## 2021-12-05 MED ORDER — VITAMIN D (ERGOCALCIFEROL) 1.25 MG (50000 UNIT) PO CAPS: 50000.0000 [IU] | ORAL_CAPSULE | ORAL | 0 refills | Status: DC

## 2021-12-05 MED ORDER — TIRZEPATIDE 2.5 MG/0.5ML ~~LOC~~ SOAJ
2.5000 mg | SUBCUTANEOUS | 0 refills | Status: DC
Start: 2021-12-05 — End: 2021-12-28

## 2021-12-06 ENCOUNTER — Encounter (INDEPENDENT_AMBULATORY_CARE_PROVIDER_SITE_OTHER): Payer: Self-pay

## 2021-12-06 ENCOUNTER — Telehealth (INDEPENDENT_AMBULATORY_CARE_PROVIDER_SITE_OTHER): Payer: Self-pay | Admitting: Bariatrics

## 2021-12-06 NOTE — Telephone Encounter (Signed)
Dr. Manson Passey - Prior authorization approved for [facility]. Effective: 12/06/2021 - 12/07/2022. Patient sent approval message via mychart.

## 2021-12-06 NOTE — Progress Notes (Unsigned)
Chief Complaint:   OBESITY Cameron Hunter is here to discuss his progress with his obesity treatment plan along with follow-up of his obesity related diagnoses. Cameron Hunter is on the Category 4 Plan and states he is following his eating plan approximately 85% of the time. Cameron Hunter states he is walking for 60 minutes 2 times per week.  Today's visit was #: 2 Starting weight: 323 lbs Starting date: 11/21/2021 Today's weight: 321 lbs Today's date: 12/05/2021 Total lbs lost to date: 2 Total lbs lost since last in-office visit: 2  Interim History: Cameron Hunter is down 2 pounds since his first visit.  He has been on vacation.  He has cleaned out the" bad foods" from the house.  Subjective:   1. Elevated liver enzymes Cameron Hunter's AST is 53 and ALT is 81.  No hepatitis B or no hepatitis C.  2. Elevated cholesterol Cameron Hunter's total cholesterol is 238, triglycerides 181, and LDL 157.  3. Vitamin D deficiency Cameron Hunter's vitamin D level was 17.5.  4. Diabetes mellitus type 2  Cameron Hunter's A1c is 7.2, glucose is 148, and insulin 37.9.  No contraindications.  Assessment/Plan:   1. Elevated liver enzymes We will follow-up on Cameron Hunter's labs over time.  2. Elevated cholesterol Lab we will read labels, eliminate trans fats, and keep saturated fats low.  3. Vitamin D deficiency Cameron Hunter agreed to start prescription vitamin D 50,000 units weekly, with no refills.  - Vitamin D, Ergocalciferol, (DRISDOL) 1.25 MG (50000 UNIT) CAPS capsule; Take 1 capsule (50,000 Units total) by mouth every 7 (seven) days.  Dispense: 5 capsule; Refill: 0  4. Diabetes mellitus type 2  Cameron Hunter wants to wait on any medications.  He will keep his carbohydrates low.  Metformin information sheet was given.  Cameron Hunter agreed to start Mounjaro 2.5 mg once weekly, with no refills.  - tirzepatide [facility]) 2.5 MG/0.5ML Pen; Inject 2.5 mg into the skin once a week.  Dispense: 2 mL; Refill: 0  5. Obesity, Current BMI 42.4 Cameron Hunter is currently in the action stage of  change. As such, his goal is to continue with weight loss efforts. He has agreed to the Category 4 Plan with Keto components.   Cameron Hunter will follow the plan 80-90%.  Keep his water and protein intake high.  Reviewed the labs from 11/21/2021, CMP, lipid, vitamin D, A1c, and insulin.  Exercise goals: Will resume walking after his back surgery.  Behavioral modification strategies: increasing lean protein intake, decreasing simple carbohydrates, increasing vegetables, increasing water intake, decreasing eating out, no skipping meals, meal planning and cooking strategies, keeping healthy foods in the home, and planning for success.  Cameron Hunter has agreed to follow-up with our clinic in 2 to 3 weeks. He was informed of the importance of frequent follow-up visits to maximize his success with intensive lifestyle modifications for his multiple health conditions.   Objective:   Blood pressure (!) 151/89, pulse 96, temperature 97.6 F (36.4 C), height 6\' 1"  (1.854 m), weight (!) 321 lb (145.6 kg), SpO2 97 %. Body mass index is 42.35 kg/m.  General: Cooperative, alert, well developed, in no acute distress. HEENT: Conjunctivae and lids unremarkable. Cardiovascular: Regular rhythm.  Lungs: Normal work of breathing. Neurologic: No focal deficits.   Lab Results  Component Value Date   CREATININE 1.12 11/21/2021   BUN 16 11/21/2021   NA 135 11/21/2021   K 4.6 11/21/2021   CL 98 11/21/2021   CO2 22 11/21/2021   Lab Results  Component Value Date   ALT 81 (  H) 11/21/2021   AST 53 (H) 11/21/2021   ALKPHOS 62 11/21/2021   BILITOT 0.6 11/21/2021   Lab Results  Component Value Date   HGBA1C 7.2 (H) 11/21/2021   Lab Results  Component Value Date   INSULIN 37.9 (H) 11/21/2021   Lab Results  Component Value Date   TSH 1.560 11/21/2021   Lab Results  Component Value Date   CHOL 238 (H) 11/21/2021   HDL 48 11/21/2021   LDLCALC 157 (H) 11/21/2021   TRIG 181 (H) 11/21/2021   Lab Results  Component  Value Date   VD25OH 17.5 (L) 11/21/2021   No results found for: "WBC", "HGB", "HCT", "MCV", "PLT" No results found for: "IRON", "TIBC", "FERRITIN"  Attestation Statements:   Reviewed by clinician on day of visit: allergies, medications, problem list, medical history, surgical history, family history, social history, and previous encounter notes.   Trude Mcburney, am acting as Energy manager for [employer], DO.  I have reviewed the above documentation for accuracy and completeness, and I agree with the above. -  ***

## 2021-12-09 ENCOUNTER — Encounter (INDEPENDENT_AMBULATORY_CARE_PROVIDER_SITE_OTHER): Payer: Self-pay | Admitting: Bariatrics

## 2021-12-15 ENCOUNTER — Other Ambulatory Visit: Payer: Self-pay | Admitting: Internal Medicine

## 2021-12-15 DIAGNOSIS — I10 Essential (primary) hypertension: Secondary | ICD-10-CM

## 2021-12-21 ENCOUNTER — Other Ambulatory Visit: Payer: Self-pay | Admitting: Internal Medicine

## 2021-12-22 ENCOUNTER — Other Ambulatory Visit: Payer: Self-pay | Admitting: Internal Medicine

## 2021-12-22 DIAGNOSIS — M5440 Lumbago with sciatica, unspecified side: Secondary | ICD-10-CM

## 2021-12-24 ENCOUNTER — Other Ambulatory Visit: Payer: Self-pay | Admitting: Internal Medicine

## 2021-12-25 ENCOUNTER — Encounter: Payer: Self-pay | Admitting: Internal Medicine

## 2021-12-26 ENCOUNTER — Other Ambulatory Visit: Payer: Self-pay | Admitting: Internal Medicine

## 2021-12-28 ENCOUNTER — Encounter (INDEPENDENT_AMBULATORY_CARE_PROVIDER_SITE_OTHER): Payer: Self-pay | Admitting: Family Medicine

## 2021-12-28 ENCOUNTER — Ambulatory Visit (INDEPENDENT_AMBULATORY_CARE_PROVIDER_SITE_OTHER): Payer: 59 | Admitting: Family Medicine

## 2021-12-28 VITALS — BP 145/87 | HR 95 | Temp 98.0°F | Ht 73.0 in | Wt 311.0 lb

## 2021-12-28 DIAGNOSIS — M109 Gout, unspecified: Secondary | ICD-10-CM

## 2021-12-28 DIAGNOSIS — Z6841 Body Mass Index (BMI) 40.0 and over, adult: Secondary | ICD-10-CM

## 2021-12-28 DIAGNOSIS — E669 Obesity, unspecified: Secondary | ICD-10-CM

## 2021-12-28 DIAGNOSIS — Z7985 Long-term (current) use of injectable non-insulin antidiabetic drugs: Secondary | ICD-10-CM

## 2021-12-28 DIAGNOSIS — E1169 Type 2 diabetes mellitus with other specified complication: Secondary | ICD-10-CM | POA: Insufficient documentation

## 2021-12-28 MED ORDER — INDOMETHACIN 50 MG PO CAPS: 50.0000 mg | ORAL_CAPSULE | Freq: Three times a day (TID) | ORAL | 0 refills | Status: DC | PRN

## 2021-12-28 MED ORDER — TIRZEPATIDE 2.5 MG/0.5ML ~~LOC~~ SOAJ: 2.5000 mg | SUBCUTANEOUS | 0 refills | Status: DC

## 2022-01-01 NOTE — Progress Notes (Unsigned)
Chief Complaint:   OBESITY Cameron Hunter is here to discuss his progress with his obesity treatment plan along with follow-up of his obesity related diagnoses. Cameron Hunter is on the Category 4 Plan with Keto and states he is following his eating plan approximately 75% of the time. Cameron Hunter states he is walking for 30 minutes 7 times per week.  Today's visit was #: 3 Starting weight: 323 lbs Starting date: 11/21/2021 Today's weight: 311 lbs Today's date: 12/28/2021 Total lbs lost to date: 12 Total lbs lost since last in-office visit: 10  Interim History: Cameron Hunter continues to do well with weight loss.  His hunger is mostly controlled and he is doing well with portion control.  He may not be eating enough at times, which will eventually decrease his RMR and cause weight regain.  Subjective:   1. Type 2 diabetes mellitus with other specified complication, unspecified whether long term insulin use (HCC) Cameron Hunter is doing well with his diet and medications.  He is at risk of decreasing his nutrition to low and increasing his dose will be counterproductive.  2. Gout, unspecified cause, unspecified chronicity, unspecified site Cameron Hunter is on colchicine and indomethacin.  He requests a small refill to help tide him over while on vacation just in case.  Assessment/Plan:   1. Type 2 diabetes mellitus with other specified complication, unspecified whether long term insulin use (HCC) Cameron Hunter will continue Mounjaro 2.5 mg once weekly, and we will refill for 1 month.  We will follow-up at his next visit in 1 month.  - tirzepatide [facility]) 2.5 MG/0.5ML Pen; Inject 2.5 mg into the skin once a week.  Dispense: 2 mL; Refill: 0  2. Gout, unspecified cause, unspecified chronicity, unspecified site We will refill indomethacin 50 mg 3 times daily for 1 month.  - indomethacin (INDOCIN) 50 MG capsule; Take 1 capsule (50 mg total) by mouth 3 (three) times daily as needed.  Dispense: 90 capsule; Refill: 0  3. Obesity, Current  BMI 41.1 Cameron Hunter is currently in the action stage of change. As such, his goal is to continue with weight loss efforts. He has agreed to change to keeping a food journal and adhering to recommended goals of 1700-2400 calories and 120+ grams of protein daily.   Cameron Hunter is to journal for a few days, and make sure he is not cutting his nutrition too low.  Exercise goals: As is.   Behavioral modification strategies: increasing lean protein intake.  Cameron Hunter has agreed to follow-up with our clinic in 4 weeks. He was informed of the importance of frequent follow-up visits to maximize his success with intensive lifestyle modifications for his multiple health conditions.   Objective:   Blood pressure (!) 145/87, pulse 95, temperature 98 F (36.7 C), height 6\' 1"  (1.854 m), weight (!) 311 lb (141.1 kg), SpO2 97 %. Body mass index is 41.03 kg/m.  General: Cooperative, alert, well developed, in no acute distress. HEENT: Conjunctivae and lids unremarkable. Cardiovascular: Regular rhythm.  Lungs: Normal work of breathing. Neurologic: No focal deficits.   Lab Results  Component Value Date   CREATININE 1.12 11/21/2021   BUN 16 11/21/2021   NA 135 11/21/2021   K 4.6 11/21/2021   CL 98 11/21/2021   CO2 22 11/21/2021   Lab Results  Component Value Date   ALT 81 (H) 11/21/2021   AST 53 (H) 11/21/2021   ALKPHOS 62 11/21/2021   BILITOT 0.6 11/21/2021   Lab Results  Component Value Date   HGBA1C 7.2 (  H) 11/21/2021   Lab Results  Component Value Date   INSULIN 37.9 (H) 11/21/2021   Lab Results  Component Value Date   TSH 1.560 11/21/2021   Lab Results  Component Value Date   CHOL 238 (H) 11/21/2021   HDL 48 11/21/2021   LDLCALC 157 (H) 11/21/2021   TRIG 181 (H) 11/21/2021   Lab Results  Component Value Date   VD25OH 17.5 (L) 11/21/2021   No results found for: "WBC", "HGB", "HCT", "MCV", "PLT" No results found for: "IRON", "TIBC", "FERRITIN"  Attestation Statements:   Reviewed by  clinician on day of visit: allergies, medications, problem list, medical history, surgical history, family history, social history, and previous encounter notes.   I, Burt Knack, am acting as transcriptionist for Quillian Quince, MD.  I have reviewed the above documentation for accuracy and completeness, and I agree with the above. -  Quillian Quince, MD

## 2022-01-22 ENCOUNTER — Other Ambulatory Visit: Payer: Self-pay | Admitting: Internal Medicine

## 2022-01-23 MED ORDER — PREGABALIN 50 MG PO CAPS
ORAL_CAPSULE | ORAL | 0 refills | Status: DC
Start: 2022-01-23 — End: 2022-02-26

## 2022-01-24 ENCOUNTER — Encounter (INDEPENDENT_AMBULATORY_CARE_PROVIDER_SITE_OTHER): Payer: Self-pay

## 2022-01-25 ENCOUNTER — Other Ambulatory Visit: Payer: Self-pay | Admitting: Internal Medicine

## 2022-01-25 ENCOUNTER — Other Ambulatory Visit (INDEPENDENT_AMBULATORY_CARE_PROVIDER_SITE_OTHER): Payer: Self-pay | Admitting: Family Medicine

## 2022-01-25 DIAGNOSIS — M109 Gout, unspecified: Secondary | ICD-10-CM

## 2022-01-29 ENCOUNTER — Other Ambulatory Visit (INDEPENDENT_AMBULATORY_CARE_PROVIDER_SITE_OTHER): Payer: Self-pay | Admitting: Family Medicine

## 2022-01-29 DIAGNOSIS — M109 Gout, unspecified: Secondary | ICD-10-CM

## 2022-02-03 ENCOUNTER — Other Ambulatory Visit: Payer: Self-pay | Admitting: Internal Medicine

## 2022-02-05 ENCOUNTER — Encounter (INDEPENDENT_AMBULATORY_CARE_PROVIDER_SITE_OTHER): Payer: Self-pay | Admitting: Bariatrics

## 2022-02-05 ENCOUNTER — Ambulatory Visit (INDEPENDENT_AMBULATORY_CARE_PROVIDER_SITE_OTHER): Payer: 59 | Admitting: Bariatrics

## 2022-02-05 VITALS — BP 141/82 | HR 83 | Temp 97.9°F | Ht 73.0 in | Wt 302.0 lb

## 2022-02-05 DIAGNOSIS — E669 Obesity, unspecified: Secondary | ICD-10-CM

## 2022-02-05 DIAGNOSIS — E559 Vitamin D deficiency, unspecified: Secondary | ICD-10-CM | POA: Diagnosis not present

## 2022-02-05 DIAGNOSIS — Z7985 Long-term (current) use of injectable non-insulin antidiabetic drugs: Secondary | ICD-10-CM

## 2022-02-05 DIAGNOSIS — E1169 Type 2 diabetes mellitus with other specified complication: Secondary | ICD-10-CM

## 2022-02-05 DIAGNOSIS — Z6839 Body mass index (BMI) 39.0-39.9, adult: Secondary | ICD-10-CM

## 2022-02-05 MED ORDER — VITAMIN D (ERGOCALCIFEROL) 1.25 MG (50000 UNIT) PO CAPS: 50000.0000 [IU] | ORAL_CAPSULE | ORAL | 0 refills | Status: DC

## 2022-02-05 MED ORDER — TIRZEPATIDE 5 MG/0.5ML ~~LOC~~ SOAJ: 5.0000 mg | SUBCUTANEOUS | 0 refills | Status: DC

## 2022-02-12 NOTE — Progress Notes (Unsigned)
Chief Complaint:   OBESITY Cameron Hunter is here to discuss his progress with his obesity treatment plan along with follow-up of his obesity related diagnoses. Cameron Hunter is on the Category 4 Plan and states he is following his eating plan approximately 75% of the time. Cameron Hunter states he is walking 40102 steps daily 7 times per week.  Today's visit was #: 4 Starting weight: 323 lbs Starting date: 11/21/2021 Today's weight: 302 lbs Today's date: 02/05/22 Total lbs lost to date: 21 Total lbs lost since last in-office visit: -9  Interim History: He is down 9 pounds since his last visit.  He denies any major struggles.  He is going to be on vacation.  Subjective:   1. Vitamin D deficiency Taking as directed.  2. Diabetes mellitus type 2  Taking Mounjaro.  No side effects.  Assessment/Plan:   1. Vitamin D deficiency Refill - Vitamin D, Ergocalciferol, (DRISDOL) 1.25 MG (50000 UNIT) CAPS capsule; Take 1 capsule (50,000 Units total) by mouth every 7 (seven) days.  Dispense: 5 capsule; Refill: 0  2. Diabetes mellitus type 2  Refill Mounjaro 5 mg into the skin once a week, dispense 2 mL, no refills.  3. Obesity, Current BMI 39.9 1.  Meal planning. 2.  Intentional eating. 3.  Travel tips.  Cameron Hunter is currently in the action stage of change. As such, his goal is to continue with weight loss efforts. He has agreed to the Category 4 Plan.   Exercise goals: Getting his steps in-aiming for 10,000 steps per day.  Behavioral modification strategies: increasing lean protein intake, decreasing simple carbohydrates, increasing vegetables, increasing water intake, decreasing eating out, no skipping meals, meal planning and cooking strategies, keeping healthy foods in the home, and planning for success.  Cameron Hunter has agreed to follow-up with our clinic in 2-3 weeks. He was informed of the importance of frequent follow-up visits to maximize his success with intensive lifestyle modifications for his multiple  health conditions.    Objective:   Blood pressure (!) 141/82, pulse 83, temperature 97.9 F (36.6 C), height 6\' 1"  (1.854 m), weight (!) 302 lb (137 kg), SpO2 97 %. Body mass index is 39.84 kg/m.  General: Cooperative, alert, well developed, in no acute distress. HEENT: Conjunctivae and lids unremarkable. Cardiovascular: Regular rhythm.  Lungs: Normal work of breathing. Neurologic: No focal deficits.   Lab Results  Component Value Date   CREATININE 1.12 11/21/2021   BUN 16 11/21/2021   NA 135 11/21/2021   K 4.6 11/21/2021   CL 98 11/21/2021   CO2 22 11/21/2021   Lab Results  Component Value Date   ALT 81 (H) 11/21/2021   AST 53 (H) 11/21/2021   ALKPHOS 62 11/21/2021   BILITOT 0.6 11/21/2021   Lab Results  Component Value Date   HGBA1C 7.2 (H) 11/21/2021   Lab Results  Component Value Date   INSULIN 37.9 (H) 11/21/2021   Lab Results  Component Value Date   TSH 1.560 11/21/2021   Lab Results  Component Value Date   CHOL 238 (H) 11/21/2021   HDL 48 11/21/2021   LDLCALC 157 (H) 11/21/2021   TRIG 181 (H) 11/21/2021   Lab Results  Component Value Date   VD25OH 17.5 (L) 11/21/2021   No results found for: "WBC", "HGB", "HCT", "MCV", "PLT" No results found for: "IRON", "TIBC", "FERRITIN"   Attestation Statements:   Reviewed by clinician on day of visit: allergies, medications, problem list, medical history, surgical history, family history, social history, and  previous encounter notes.  I, Dawn Whitmire, FNP-C, am acting as transcriptionist for Dr. Corinna Capra.  I have reviewed the above documentation for accuracy and completeness, and I agree with the above. Corinna Capra, DO

## 2022-02-13 ENCOUNTER — Encounter (INDEPENDENT_AMBULATORY_CARE_PROVIDER_SITE_OTHER): Payer: Self-pay | Admitting: Bariatrics

## 2022-02-26 ENCOUNTER — Encounter: Payer: Self-pay | Admitting: Internal Medicine

## 2022-02-26 ENCOUNTER — Other Ambulatory Visit: Payer: Self-pay | Admitting: Internal Medicine

## 2022-02-26 ENCOUNTER — Other Ambulatory Visit: Payer: Self-pay | Admitting: *Deleted

## 2022-02-26 MED ORDER — PREGABALIN 50 MG PO CAPS: ORAL_CAPSULE | ORAL | 0 refills | Status: DC

## 2022-03-05 ENCOUNTER — Ambulatory Visit: Payer: 59 | Admitting: Bariatrics

## 2022-03-05 VITALS — BP 127/77 | HR 75 | Temp 97.6°F | Ht 73.0 in | Wt 292.0 lb

## 2022-03-05 DIAGNOSIS — Z6838 Body mass index (BMI) 38.0-38.9, adult: Secondary | ICD-10-CM | POA: Diagnosis not present

## 2022-03-05 DIAGNOSIS — E669 Obesity, unspecified: Secondary | ICD-10-CM | POA: Diagnosis not present

## 2022-03-05 DIAGNOSIS — E559 Vitamin D deficiency, unspecified: Secondary | ICD-10-CM | POA: Diagnosis not present

## 2022-03-05 DIAGNOSIS — E1169 Type 2 diabetes mellitus with other specified complication: Secondary | ICD-10-CM | POA: Diagnosis not present

## 2022-03-05 DIAGNOSIS — Z6841 Body Mass Index (BMI) 40.0 and over, adult: Secondary | ICD-10-CM

## 2022-03-05 DIAGNOSIS — Z7985 Long-term (current) use of injectable non-insulin antidiabetic drugs: Secondary | ICD-10-CM

## 2022-03-05 MED ORDER — TIRZEPATIDE 5 MG/0.5ML ~~LOC~~ SOAJ: 5.0000 mg | SUBCUTANEOUS | 0 refills | Status: DC

## 2022-03-07 NOTE — Progress Notes (Unsigned)
     Chief Complaint:   OBESITY Cameron Hunter is here to discuss his progress with his obesity treatment plan along with follow-up of his obesity related diagnoses. Cameron Hunter is on {MWMwtlossportion/plan2:23431} and states he is following his eating plan approximately ***% of the time. Cameron Hunter states he is *** *** minutes *** times per week.  Today's visit was #: *** Starting weight: *** Starting date: *** Today's weight: *** Today's date: 03/05/2022 Total lbs lost to date: *** Total lbs lost since last in-office visit: ***  Interim History: ***  Subjective:   1. Diabetes mellitus type 2  ***  2. Vitamin D deficiency ***  Assessment/Plan:   1. Diabetes mellitus type 2  *** - tirzepatide (MOUNJARO) 5 MG/0.5ML Pen; Inject 5 mg into the skin once a week.  Dispense: 2 mL; Refill: 0  2. Vitamin D deficiency ***  3. Obesity, Current BMI 38.6 Cameron Hunter {CHL AMB IS/IS NOT:210130109} currently in the action stage of change. As such, his goal is to {MWMwtloss#1:210800005}. He has agreed to {MWMwtlossportion/plan2:23431}.   Exercise goals: {MWM EXERCISE RECS:23473}  Behavioral modification strategies: {MWMwtlossdietstrategies3:23432}.  Cameron Hunter has agreed to follow-up with our clinic in {NUMBER 1-10:22536} weeks. He was informed of the importance of frequent follow-up visits to maximize his success with intensive lifestyle modifications for his multiple health conditions.   Objective:   Blood pressure 127/77, pulse 75, temperature 97.6 F (36.4 C), height 6\' 1"  (1.854 m), weight 292 lb (132.5 kg), SpO2 98 %. Body mass index is 38.52 kg/m.  General: Cooperative, alert, well developed, in no acute distress. HEENT: Conjunctivae and lids unremarkable. Cardiovascular: Regular rhythm.  Lungs: Normal work of breathing. Neurologic: No focal deficits.   Lab Results  Component Value Date   CREATININE 1.12 11/21/2021   BUN 16 11/21/2021   NA 135 11/21/2021   K 4.6 11/21/2021   CL 98 11/21/2021    CO2 22 11/21/2021   Lab Results  Component Value Date   ALT 81 (H) 11/21/2021   AST 53 (H) 11/21/2021   ALKPHOS 62 11/21/2021   BILITOT 0.6 11/21/2021   Lab Results  Component Value Date   HGBA1C 7.2 (H) 11/21/2021   Lab Results  Component Value Date   INSULIN 37.9 (H) 11/21/2021   Lab Results  Component Value Date   TSH 1.560 11/21/2021   Lab Results  Component Value Date   CHOL 238 (H) 11/21/2021   HDL 48 11/21/2021   LDLCALC 157 (H) 11/21/2021   TRIG 181 (H) 11/21/2021   Lab Results  Component Value Date   VD25OH 17.5 (L) 11/21/2021   No results found for: "WBC", "HGB", "HCT", "MCV", "PLT" No results found for: "IRON", "TIBC", "FERRITIN"  Attestation Statements:   Reviewed by clinician on day of visit: allergies, medications, problem list, medical history, surgical history, family history, social history, and previous encounter notes.   Wilhemena Durie, am acting as Location manager for [employer], DO.  I have reviewed the above documentation for accuracy and completeness, and I agree with the above. -  ***

## 2022-03-08 ENCOUNTER — Encounter: Payer: Self-pay | Admitting: Bariatrics

## 2022-03-12 LAB — HM DIABETES EYE EXAM

## 2022-03-20 ENCOUNTER — Encounter: Payer: Self-pay | Admitting: Internal Medicine

## 2022-03-21 NOTE — Progress Notes (Signed)
Established Patient Office Visit  Subjective:  Patient ID: Cameron Hunter, male    DOB: 04-05-1971  Age: 51 y.o. MRN: [id number]  CC:  Chief Complaint  Patient presents with   Follow-up     HPI  Cameron Hunter presents for routine follow-up and medication refill. He was out of BP medication since a week.  Patient had history of low back pain, hypertension, hyperlipidemia, diabetes and obesity.  [facility]. He is taking Mounjaro he is overall doing fine and no concerns at present.   HPI   Past Medical History:  Diagnosis Date   Anxiety    Chronic fatigue syndrome    Gout    High blood pressure    Joint pain    Knee pain    Right knee    Knee pain     Past Surgical History:  Procedure Laterality Date   BACK SURGERY      Family History  Problem Relation Age of Onset   Lung cancer Mother    Diabetes Mother    COPD Mother    Heart disease Mother    Kidney disease Mother    Thyroid disease Mother    Obesity Mother     Social History   Socioeconomic History   Marital status: Married    Spouse name: Not on file   Number of children: Not on file   Years of education: Not on file   Highest education level: Not on file  Occupational History   Not on file  Tobacco Use   Smoking status: Never   Smokeless tobacco: Never  Vaping Use   Vaping Use: Never used  Substance and Sexual Activity   Alcohol use: Not Currently   Drug use: Never   Sexual activity: Not on file  Other Topics Concern   Not on file  Social History Narrative   Right handed    Caffeine occasional     Lives with wife at home    Social Determinants of Health   Financial Resource Strain: Not on file  Food Insecurity: Not on file  Transportation Needs: Not on file  Physical Activity: Not on file  Stress: Not on file  Social Connections: Not on file  Intimate Partner Violence: Not on file      Outpatient Medications Prior to Visit  Medication Sig Dispense Refill   Acetaminophen (TYLENOL PO) Take by mouth.     amLODipine (NORVASC) 10 MG tablet TAKE 1 TABLET (10 MG TOTAL) BY MOUTH DAILY. 30 tablet 3   indomethacin (INDOCIN) 50 MG capsule Take 1 capsule (50 mg total) by mouth 3 (three) times daily as needed. 90 capsule 0   tirzepatide (MOUNJARO) 5 MG/0.5ML Pen Inject 5 mg into the skin once a week. 2 mL 0   Vitamin D, Ergocalciferol, (DRISDOL) 1.25 MG (50000 UNIT) CAPS capsule Take 1 capsule (50,000 Units total) by mouth every 7 (seven) days. 5 capsule 0   allopurinol (ZYLOPRIM) 300 MG tablet Take 1 tablet (300 mg total) by mouth daily. 90 tablet 1   colchicine 0.6 MG tablet TAKE 1 TABLET BY MOUTH 2 TIMES DAILY. 60 tablet 2   cyclobenzaprine (FLEXERIL) 5 MG tablet TAKE 1 TABLET BY MOUTH 2 TIMES DAILY AS NEEDED FOR MUSCLE SPASMS. 60 tablet 3   gabapentin (NEURONTIN) 100 MG capsule Take 1 capsule (100 mg total) by mouth 3 (three) times daily. 60 capsule 0  HYDROcodone-acetaminophen (NORCO/VICODIN) 5-325 MG tablet Take 1 tablet by mouth every 6 (six) hours as needed. 15 tablet 0   lisinopril (ZESTRIL) 30 MG tablet TAKE 1 TABLET (30 MG TOTAL) BY MOUTH DAILY. 30 tablet 3   meloxicam (MOBIC) 15 MG tablet TAKE 1 TABLET BY MOUTH DAILY. 30 tablet 3   pregabalin (LYRICA) 50 MG capsule TAKE 2 CAPSULES BY MOUTH 2 TIMES A DAY 120 capsule 0   No facility-administered medications prior to visit.    No Known Allergies  ROS Review of Systems  Constitutional: Negative.   HENT: Negative.    Eyes: Negative.   Respiratory: Negative.  Negative for chest tightness and shortness of breath.   Cardiovascular:  Negative for chest pain and palpitations.  Gastrointestinal: Negative.   Genitourinary: Negative.   Musculoskeletal: Negative.   Neurological:  Negative for dizziness and facial asymmetry.  Psychiatric/Behavioral:  Negative for agitation, behavioral problems and confusion.        Objective:    Physical Exam Constitutional:      Appearance: Normal appearance. He is obese.  HENT:     Head: Normocephalic and atraumatic.     Right Ear: Tympanic membrane normal.     Left Ear: Tympanic membrane normal.     Nose: Nose normal.     Mouth/Throat:     Mouth: Mucous membranes are moist.     Pharynx: Oropharynx is clear.  Eyes:     Conjunctiva/sclera: Conjunctivae normal.     Pupils: Pupils are equal, round, and reactive to light.  Cardiovascular:     Rate and Rhythm: Normal rate and regular rhythm.  Pulmonary:     Effort: Pulmonary effort is normal.  Abdominal:     General: Bowel sounds are normal. There is no distension.     Palpations: Abdomen is soft.     Hernia: No hernia is present.  Musculoskeletal:        General: Normal range of motion.  Skin:    General: Skin is warm.     Coloration: Skin is not jaundiced.     Findings: No erythema.  Neurological:     General: No focal deficit present.     Mental Status: He is alert and oriented to person, place, and time. Mental status is at baseline.  Psychiatric:        Mood and Affect: Mood normal.        Behavior: Behavior normal.        Thought Content: Thought content normal.        Judgment: Judgment normal.     BP 131/84   Pulse 83   Ht 6' 1" (1.854 m)   Wt 287 lb 6.4 oz (130.4 kg)   BMI 37.92 kg/m  Wt Readings from Last 3 Encounters:  03/22/22 287 lb 6.4 oz (130.4 kg)  03/05/22 292 lb (132.5 kg)  02/05/22 (!) 302 lb (137 kg)     Health Maintenance  Topic Date Due   FOOT EXAM  Never done   HIV Screening  Never done   Diabetic kidney evaluation - Urine ACR  Never done   Hepatitis C Screening  Never done   COLONOSCOPY (Pts 45-68yr Insurance coverage will need to be confirmed)  Never done   Zoster Vaccines- Shingrix (1 of 2) Never done   INFLUENZA VACCINE  Never done   COVID-19 Vaccine (3 - Pfizer series) 04/07/2022 (Originally 11/07/2019)   TETANUS/TDAP  03/24/2022   HEMOGLOBIN A1C   05/23/2022   Diabetic kidney evaluation - GFR  measurement  11/22/2022   OPHTHALMOLOGY EXAM  03/13/2023   HPV VACCINES  Aged Out    There are no preventive care reminders to display for this patient.  Lab Results  Component Value Date   TSH 1.560 11/21/2021   No results found for: "WBC", "HGB", "HCT", "MCV", "PLT" Lab Results  Component Value Date   NA 135 11/21/2021   K 4.6 11/21/2021   CO2 22 11/21/2021   GLUCOSE 148 (H) 11/21/2021   BUN 16 11/21/2021   CREATININE 1.12 11/21/2021   BILITOT 0.6 11/21/2021   ALKPHOS 62 11/21/2021   AST 53 (H) 11/21/2021   ALT 81 (H) 11/21/2021   PROT 7.8 11/21/2021   ALBUMIN 5.0 (H) 11/21/2021   CALCIUM 9.8 11/21/2021   EGFR 79 11/21/2021   GFR 73.39 06/20/2021   Lab Results  Component Value Date   CHOL 238 (H) 11/21/2021   Lab Results  Component Value Date   HDL 48 11/21/2021   Lab Results  Component Value Date   LDLCALC 157 (H) 11/21/2021   Lab Results  Component Value Date   TRIG 181 (H) 11/21/2021   No results found for: "CHOLHDL" Lab Results  Component Value Date   HGBA1C 7.2 (H) 11/21/2021      Assessment & Plan:   Problem List Items Addressed This Visit       Cardiovascular and Mediastinum   Primary hypertension - Primary    Patient blood pressure elevated 131/84 in the office today. He states he was out of blood pressure medication.   Encouraged patient to check blood pressure at home and bring the readings to next doctor's appointment. Refilled lisinopril 30 mg. We will continue to monitor if needed would adjust the medication.      Relevant Medications   lisinopril (ZESTRIL) 30 MG tablet     Endocrine   Type 2 diabetes mellitus with other specified complication [facility])    [facility]. He is taking Mounjaro and managing his diet.       Relevant Medications   lisinopril (ZESTRIL) 30 MG tablet   Other Relevant Orders    Microalbumin, urine (Completed)     Nervous and Auditory   Lumbago of lumbar region with sciatica    Stable with medication.       Relevant Medications   meloxicam (MOBIC) 15 MG tablet   cyclobenzaprine (FLEXERIL) 5 MG tablet   pregabalin (LYRICA) 50 MG capsule     Meds ordered this encounter  Medications   DISCONTD: pregabalin (LYRICA) 50 MG capsule    Sig: TAKE 2 CAPSULES BY MOUTH 2 TIMES A DAY    Dispense:  120 capsule    Refill:  0   meloxicam (MOBIC) 15 MG tablet    Sig: TAKE 1 TABLET BY MOUTH DAILY.    Dispense:  30 tablet    Refill:  3   lisinopril (ZESTRIL) 30 MG tablet    Sig: Take 1 tablet (30 mg total) by mouth daily.    Dispense:  30 tablet    Refill:  3   cyclobenzaprine (FLEXERIL) 5 MG tablet    Sig: Bid    Dispense:  60 tablet    Refill:  3   pregabalin (LYRICA) 50 MG capsule    Sig: TAKE 2 CAPSULES BY MOUTH 2 TIMES A DAY    Dispense:  120 capsule    Refill:  0     Follow-up: No follow-ups  on file.    Theresia Lo, NP

## 2022-03-22 ENCOUNTER — Ambulatory Visit: Payer: 59 | Admitting: Nurse Practitioner

## 2022-03-22 ENCOUNTER — Other Ambulatory Visit: Payer: Self-pay | Admitting: *Deleted

## 2022-03-22 ENCOUNTER — Encounter: Payer: Self-pay | Admitting: Nurse Practitioner

## 2022-03-22 VITALS — BP 131/84 | HR 83 | Ht 73.0 in | Wt 287.4 lb

## 2022-03-22 DIAGNOSIS — E1169 Type 2 diabetes mellitus with other specified complication: Secondary | ICD-10-CM

## 2022-03-22 DIAGNOSIS — I10 Essential (primary) hypertension: Secondary | ICD-10-CM | POA: Diagnosis not present

## 2022-03-22 DIAGNOSIS — M5440 Lumbago with sciatica, unspecified side: Secondary | ICD-10-CM | POA: Diagnosis not present

## 2022-03-22 LAB — MICROALBUMIN / CREATININE URINE RATIO: Microalb, Ur: 14.3

## 2022-03-22 MED ORDER — PREGABALIN 50 MG PO CAPS: ORAL_CAPSULE | ORAL | 0 refills | Status: DC

## 2022-03-22 MED ORDER — LISINOPRIL 30 MG PO TABS: 30.0000 mg | ORAL_TABLET | Freq: Every day | ORAL | 3 refills | Status: DC

## 2022-03-22 MED ORDER — CYCLOBENZAPRINE HCL 5 MG PO TABS: ORAL_TABLET | ORAL | 3 refills | Status: DC

## 2022-03-22 MED ORDER — MELOXICAM 15 MG PO TABS: ORAL_TABLET | ORAL | 3 refills | Status: DC

## 2022-03-23 LAB — MICROALBUMIN, URINE: Microalb, Ur: 14.3 mg/dL

## 2022-03-23 NOTE — Assessment & Plan Note (Signed)
Patient blood pressure elevated 131/84 in the office today. He states he was out of blood pressure medication.   Encouraged patient to check blood pressure at home and bring the readings to next doctor's appointment. Refilled lisinopril 30 mg. We will continue to monitor if needed would adjust the medication.

## 2022-03-23 NOTE — Assessment & Plan Note (Signed)
Stable with medication.

## 2022-03-23 NOTE — Assessment & Plan Note (Addendum)
[facility]. He is taking Mounjaro and managing his diet.

## 2022-04-02 ENCOUNTER — Other Ambulatory Visit: Payer: Self-pay | Admitting: Bariatrics

## 2022-04-02 ENCOUNTER — Ambulatory Visit: Payer: 59 | Admitting: Bariatrics

## 2022-04-02 DIAGNOSIS — E1169 Type 2 diabetes mellitus with other specified complication: Secondary | ICD-10-CM

## 2022-04-05 ENCOUNTER — Encounter: Payer: Self-pay | Admitting: Bariatrics

## 2022-04-05 ENCOUNTER — Ambulatory Visit: Payer: 59 | Admitting: Bariatrics

## 2022-04-05 VITALS — BP 145/83 | HR 84 | Temp 97.9°F | Ht 73.0 in | Wt 280.0 lb

## 2022-04-05 DIAGNOSIS — E669 Obesity, unspecified: Secondary | ICD-10-CM | POA: Diagnosis not present

## 2022-04-05 DIAGNOSIS — E559 Vitamin D deficiency, unspecified: Secondary | ICD-10-CM

## 2022-04-05 DIAGNOSIS — Z6837 Body mass index (BMI) 37.0-37.9, adult: Secondary | ICD-10-CM

## 2022-04-05 DIAGNOSIS — E1169 Type 2 diabetes mellitus with other specified complication: Secondary | ICD-10-CM

## 2022-04-05 DIAGNOSIS — Z7985 Long-term (current) use of injectable non-insulin antidiabetic drugs: Secondary | ICD-10-CM

## 2022-04-05 MED ORDER — VITAMIN D (ERGOCALCIFEROL) 1.25 MG (50000 UNIT) PO CAPS: 50000.0000 [IU] | ORAL_CAPSULE | ORAL | 0 refills | Status: DC

## 2022-04-05 MED ORDER — TIRZEPATIDE 5 MG/0.5ML ~~LOC~~ SOAJ: 5.0000 mg | SUBCUTANEOUS | 0 refills | Status: DC

## 2022-04-09 ENCOUNTER — Encounter: Payer: Self-pay | Admitting: Bariatrics

## 2022-04-10 NOTE — Telephone Encounter (Signed)
Please reschedule pt appt

## 2022-04-15 NOTE — Progress Notes (Signed)
Chief Complaint:   OBESITY Cameron Hunter is here to discuss his progress with his obesity treatment plan along with follow-up of his obesity related diagnoses. Cameron Hunter is on the Category 4 Plan and states he is following his eating plan approximately 75% of the time. Cameron Hunter states he is walking 10,000 steps 7 times per week.    Today's visit was #: 6 Starting weight: 323 lbs Starting date: 11/21/2021 Today's weight: 280 lbs Today's date: 04/05/2022 Total lbs lost to date: 43 Total lbs lost since last in-office visit: 12  Interim History: Cameron Hunter is down 12 pounds since his last visit.  He is not hungry most of the time.  Subjective:   1. Vitamin D deficiency Cameron Hunter is taking prescription vitamin D as directed.  2. Diabetes mellitus type 2  Cameron Hunter is taking Mounjaro.   Assessment/Plan:   1. Vitamin D deficiency We will refill prescription Vitamin D for 1 month. Cameron Hunter will follow-up for routine testing of Vitamin D, at least 2-3 times per year to avoid over-replacement.  - Vitamin D, Ergocalciferol, (DRISDOL) 1.25 MG (50000 UNIT) CAPS capsule; Take 1 capsule (50,000 Units total) by mouth every 7 (seven) days.  Dispense: 5 capsule; Refill: 0  2. Diabetes mellitus type 2  Cameron Hunter will continue Mounjaro at 5 mg once weekly, and we will refill for 1 month.   - tirzepatide [facility]) 5 MG/0.5ML Pen; Inject 5 mg into the skin once a week.  Dispense: 2 mL; Refill: 0  3. Obesity, Current BMI 37.0 Cameron Hunter is currently in the action stage of change. As such, his goal is to continue with weight loss efforts. He has agreed to the Category 4 Plan.   He will adhere closely to the plan at 80-90%.  He will work on making better choices.  Exercise goals: As is.  Behavioral modification strategies: increasing lean protein intake, decreasing simple carbohydrates, increasing vegetables, increasing water intake, decreasing eating out, no skipping meals, meal planning and cooking strategies, keeping healthy  foods in the home, and planning for success.  Cameron Hunter has agreed to follow-up with our clinic in 3 weeks. He was informed of the importance of frequent follow-up visits to maximize his success with intensive lifestyle modifications for his multiple health conditions.   Objective:   Blood pressure (!) 145/83, pulse 84, temperature 97.9 F (36.6 C), height 6\' 1"  (1.854 m), weight 280 lb (127 kg), SpO2 97 %. Body mass index is 36.94 kg/m.  General: Cooperative, alert, well developed, in no acute distress. HEENT: Conjunctivae and lids unremarkable. Cardiovascular: Regular rhythm.  Lungs: Normal work of breathing. Neurologic: No focal deficits.   Lab Results  Component Value Date   CREATININE 1.12 11/21/2021   BUN 16 11/21/2021   NA 135 11/21/2021   K 4.6 11/21/2021   CL 98 11/21/2021   CO2 22 11/21/2021   Lab Results  Component Value Date   ALT 81 (H) 11/21/2021   AST 53 (H) 11/21/2021   ALKPHOS 62 11/21/2021   BILITOT 0.6 11/21/2021   Lab Results  Component Value Date   HGBA1C 7.2 (H) 11/21/2021   Lab Results  Component Value Date   INSULIN 37.9 (H) 11/21/2021   Lab Results  Component Value Date   TSH 1.560 11/21/2021   Lab Results  Component Value Date   CHOL 238 (H) 11/21/2021   HDL 48 11/21/2021   LDLCALC 157 (H) 11/21/2021   TRIG 181 (H) 11/21/2021   Lab Results  Component Value Date   VD25OH  17.5 (L) 11/21/2021   No results found for: "WBC", "HGB", "HCT", "MCV", "PLT" No results found for: "IRON", "TIBC", "FERRITIN"  Attestation Statements:   Reviewed by clinician on day of visit: allergies, medications, problem list, medical history, surgical history, family history, social history, and previous encounter notes.   Cameron Hunter, am acting as Energy manager for [employer], DO.  I have reviewed the above documentation for accuracy and completeness, and I agree with the above. Corinna Capra, DO

## 2022-04-24 ENCOUNTER — Encounter: Payer: Self-pay | Admitting: Bariatrics

## 2022-04-24 ENCOUNTER — Ambulatory Visit: Payer: 59 | Admitting: Internal Medicine

## 2022-04-24 ENCOUNTER — Other Ambulatory Visit: Payer: Self-pay

## 2022-04-24 DIAGNOSIS — M5440 Lumbago with sciatica, unspecified side: Secondary | ICD-10-CM

## 2022-04-24 MED ORDER — PREGABALIN 50 MG PO CAPS: ORAL_CAPSULE | ORAL | 0 refills | Status: DC

## 2022-04-24 MED ORDER — CYCLOBENZAPRINE HCL 5 MG PO TABS: ORAL_TABLET | ORAL | 3 refills | Status: DC

## 2022-04-24 NOTE — Progress Notes (Signed)
One month of medication sent in per request of Doran Heater 04/24/22 at 2:24pm.

## 2022-04-26 ENCOUNTER — Ambulatory Visit: Payer: 59 | Admitting: Nurse Practitioner

## 2022-05-01 ENCOUNTER — Ambulatory Visit: Payer: 59 | Admitting: Bariatrics

## 2022-05-03 ENCOUNTER — Other Ambulatory Visit: Payer: Self-pay | Admitting: Bariatrics

## 2022-05-03 DIAGNOSIS — E1169 Type 2 diabetes mellitus with other specified complication: Secondary | ICD-10-CM

## 2022-05-04 ENCOUNTER — Other Ambulatory Visit: Payer: Self-pay | Admitting: Bariatrics

## 2022-05-04 DIAGNOSIS — E559 Vitamin D deficiency, unspecified: Secondary | ICD-10-CM

## 2022-05-07 ENCOUNTER — Ambulatory Visit: Payer: 59 | Admitting: Bariatrics

## 2022-05-08 ENCOUNTER — Encounter: Payer: Self-pay | Admitting: Bariatrics

## 2022-05-08 ENCOUNTER — Ambulatory Visit: Payer: 59 | Admitting: Bariatrics

## 2022-05-08 VITALS — BP 130/75 | HR 85 | Temp 97.7°F | Ht 73.0 in | Wt 269.0 lb

## 2022-05-08 DIAGNOSIS — E559 Vitamin D deficiency, unspecified: Secondary | ICD-10-CM

## 2022-05-08 DIAGNOSIS — Z7985 Long-term (current) use of injectable non-insulin antidiabetic drugs: Secondary | ICD-10-CM

## 2022-05-08 DIAGNOSIS — R748 Abnormal levels of other serum enzymes: Secondary | ICD-10-CM | POA: Diagnosis not present

## 2022-05-08 DIAGNOSIS — E7800 Pure hypercholesterolemia, unspecified: Secondary | ICD-10-CM

## 2022-05-08 DIAGNOSIS — E1169 Type 2 diabetes mellitus with other specified complication: Secondary | ICD-10-CM | POA: Diagnosis not present

## 2022-05-08 DIAGNOSIS — E669 Obesity, unspecified: Secondary | ICD-10-CM

## 2022-05-08 DIAGNOSIS — Z6835 Body mass index (BMI) 35.0-35.9, adult: Secondary | ICD-10-CM

## 2022-05-08 MED ORDER — VITAMIN D (ERGOCALCIFEROL) 1.25 MG (50000 UNIT) PO CAPS: 50000.0000 [IU] | ORAL_CAPSULE | ORAL | 0 refills | Status: DC

## 2022-05-08 MED ORDER — MOUNJARO 5 MG/0.5ML ~~LOC~~ SOAJ: SUBCUTANEOUS | 0 refills | Status: DC

## 2022-05-10 LAB — COMPREHENSIVE METABOLIC PANEL WITH GFR
ALT: 32 IU/L (ref 0–44)
AST: 41 IU/L — ABNORMAL HIGH (ref 0–40)
Albumin/Globulin Ratio: 2 (ref 1.2–2.2)
Albumin: 4.8 g/dL (ref 3.8–4.9)
Alkaline Phosphatase: 68 IU/L (ref 44–121)
BUN/Creatinine Ratio: 16 (ref 9–20)
BUN: 20 mg/dL (ref 6–24)
Bilirubin Total: 0.8 mg/dL (ref 0.0–1.2)
CO2: 20 mmol/L (ref 20–29)
Calcium: 9.4 mg/dL (ref 8.7–10.2)
Chloride: 101 mmol/L (ref 96–106)
Creatinine, Ser: 1.28 mg/dL — ABNORMAL HIGH (ref 0.76–1.27)
Globulin, Total: 2.4 g/dL (ref 1.5–4.5)
Glucose: 88 mg/dL (ref 70–99)
Potassium: 4.6 mmol/L (ref 3.5–5.2)
Sodium: 140 mmol/L (ref 134–144)
Total Protein: 7.2 g/dL (ref 6.0–8.5)
eGFR: 67 mL/min/1.73

## 2022-05-10 LAB — LIPID PANEL WITH LDL/HDL RATIO
Cholesterol, Total: 183 mg/dL (ref 100–199)
HDL: 53 mg/dL (ref >39)
LDL Chol Calc (NIH): 118 mg/dL — ABNORMAL HIGH (ref 0–99)
LDL/HDL Ratio: 2.2 ratio (ref 0.0–3.6)
Triglycerides: 65 mg/dL (ref 0–149)
VLDL Cholesterol Cal: 12 mg/dL (ref 5–40)

## 2022-05-10 LAB — INSULIN, RANDOM: INSULIN: 16.1 u[IU]/mL (ref 2.6–24.9)

## 2022-05-10 LAB — HEMOGLOBIN A1C
Est. average glucose Bld gHb Est-mCnc: 120 mg/dL
Hgb A1c MFr Bld: 5.8 % — ABNORMAL HIGH (ref 4.8–5.6)

## 2022-05-10 LAB — VITAMIN D 25 HYDROXY (VIT D DEFICIENCY, FRACTURES): Vit D, 25-Hydroxy: 50.7 ng/mL (ref 30.0–100.0)

## 2022-05-16 LAB — COLOGUARD

## 2022-05-21 ENCOUNTER — Encounter: Payer: Self-pay | Admitting: Bariatrics

## 2022-05-21 ENCOUNTER — Encounter: Payer: Self-pay | Admitting: Internal Medicine

## 2022-05-21 NOTE — Progress Notes (Signed)
Chief Complaint:   OBESITY Cameron Hunter is here to discuss his progress with his obesity treatment plan along with follow-up of his obesity related diagnoses. Cameron Hunter is on the Category 4 Plan and states he is following his eating plan approximately 75% of the time. Cameron Hunter states he is walking 10,000 steps 7 times per week.  Today's visit was #: 7 Starting weight: 323 lbs Starting date: 11/21/2021 Today's weight: 269 lbs Today's date: 05/08/2022 Total lbs lost to date: 54 Total lbs lost since last in-office visit: 11  Interim History: Cameron Hunter is down 11 pounds since his last visit.  He has been adhering closely to the plan.  Subjective:   1. Vitamin D deficiency Cameron Hunter is taking vitamin D prescription as directed.  2. Diabetes mellitus type 2  Cameron Hunter is taking Mounjaro, and he notes it is helping with his appetite.  3. Elevated liver enzymes Cameron Hunter is working on his diet and exercise.  4. Elevated cholesterol Cameron Hunter is working on his diet and exercise.  Assessment/Plan:   1. Vitamin D deficiency We will check labs today, and we will refill prescription vitamin D 50,000 IU once weekly for 1 month.  - Vitamin D, Ergocalciferol, (DRISDOL) 1.25 MG (50000 UNIT) CAPS capsule; Take 1 capsule (50,000 Units total) by mouth every 7 (seven) days.  Dispense: 5 capsule; Refill: 0 - VITAMIN D 25 Hydroxy (Vit-D Deficiency, Fractures)  2. Diabetes mellitus type 2  We will check labs today, and we will refill Mounjaro 5 mg once weekly for 1 month.  - tirzepatide (MOUNJARO) 5 MG/0.5ML Pen; INJECT 5 MG INTO THE SKIN ONCE A WEEK.  Dispense: 2 mL; Refill: 0 - Comprehensive metabolic panel - Insulin, random - Hemoglobin A1c  3. Elevated liver enzymes We will check labs today, and we will follow-up at Cameron Hunter's next visit.  4. Elevated cholesterol We will check labs today, and we will follow-up at Cameron Hunter's next visit.  - Comprehensive metabolic panel - Lipid Panel With LDL/HDL Ratio  5. Obesity,  Current BMI 35.6 Cameron Hunter is currently in the action stage of change. As such, his goal is to continue with weight loss efforts. He has agreed to the Category 4 Plan.   Meal planning and intentional eating were discussed.  Strategies for Thanksgiving was given.  He will eliminate sugary drinks.  Exercise goals: As is, will add in weights.   Behavioral modification strategies: increasing lean protein intake, decreasing simple carbohydrates, increasing vegetables, increasing water intake, decreasing eating out, no skipping meals, meal planning and cooking strategies, keeping healthy foods in the home, and planning for success.  Cameron Hunter has agreed to follow-up with our clinic in 3 weeks. He was informed of the importance of frequent follow-up visits to maximize his success with intensive lifestyle modifications for his multiple health conditions.   Cameron Hunter was informed we would discuss his lab results at his next visit unless there is a critical issue that needs to be addressed sooner. Cameron Hunter agreed to keep his next visit at the agreed upon time to discuss these results.  Objective:   Blood pressure 130/75, pulse 85, temperature 97.7 F (36.5 C), height 6\' 1"  (1.854 m), weight 269 lb (122 kg), SpO2 99 %. Body mass index is 35.49 kg/m.  General: Cooperative, alert, well developed, in no acute distress. HEENT: Conjunctivae and lids unremarkable. Cardiovascular: Regular rhythm.  Lungs: Normal work of breathing. Neurologic: No focal deficits.   Lab Results  Component Value Date   CREATININE 1.28 (H) 05/08/2022  BUN 20 05/08/2022   NA 140 05/08/2022   K 4.6 05/08/2022   CL 101 05/08/2022   CO2 20 05/08/2022   Lab Results  Component Value Date   ALT 32 05/08/2022   AST 41 (H) 05/08/2022   ALKPHOS 68 05/08/2022   BILITOT 0.8 05/08/2022   Lab Results  Component Value Date   HGBA1C 5.8 (H) 05/08/2022   HGBA1C 7.2 (H) 11/21/2021   Lab Results  Component Value Date   INSULIN 16.1  05/08/2022   INSULIN 37.9 (H) 11/21/2021   Lab Results  Component Value Date   TSH 1.560 11/21/2021   Lab Results  Component Value Date   CHOL 183 05/08/2022   HDL 53 05/08/2022   LDLCALC 118 (H) 05/08/2022   TRIG 65 05/08/2022   Lab Results  Component Value Date   VD25OH 50.7 05/08/2022   VD25OH 17.5 (L) 11/21/2021   No results found for: "WBC", "HGB", "HCT", "MCV", "PLT" No results found for: "IRON", "TIBC", "FERRITIN"  Attestation Statements:   Reviewed by clinician on day of visit: allergies, medications, problem list, medical history, surgical history, family history, social history, and previous encounter notes.   Wilhemena Durie, am acting as Location manager for [employer], DO.  I have reviewed the above documentation for accuracy and completeness, and I agree with the above. Cameron Hunter Lesch, DO

## 2022-05-24 MED ORDER — PREGABALIN 50 MG PO CAPS: ORAL_CAPSULE | ORAL | 0 refills | Status: DC

## 2022-05-28 ENCOUNTER — Other Ambulatory Visit: Payer: Self-pay | Admitting: Nurse Practitioner

## 2022-05-28 MED ORDER — PREGABALIN 50 MG PO CAPS: ORAL_CAPSULE | ORAL | 0 refills | Status: DC

## 2022-06-01 ENCOUNTER — Encounter: Payer: Self-pay | Admitting: Bariatrics

## 2022-06-02 ENCOUNTER — Other Ambulatory Visit: Payer: Self-pay | Admitting: Internal Medicine

## 2022-06-05 ENCOUNTER — Ambulatory Visit: Payer: 59 | Admitting: Bariatrics

## 2022-06-13 ENCOUNTER — Encounter: Payer: Self-pay | Admitting: Bariatrics

## 2022-06-14 ENCOUNTER — Ambulatory Visit: Payer: 59 | Admitting: Bariatrics

## 2022-06-27 ENCOUNTER — Other Ambulatory Visit: Payer: Self-pay | Admitting: Nurse Practitioner

## 2022-07-03 ENCOUNTER — Other Ambulatory Visit: Payer: Self-pay | Admitting: Nurse Practitioner

## 2022-07-06 ENCOUNTER — Other Ambulatory Visit: Payer: Self-pay | Admitting: Nurse Practitioner

## 2022-07-06 ENCOUNTER — Other Ambulatory Visit: Payer: Self-pay | Admitting: Bariatrics

## 2022-07-06 DIAGNOSIS — E559 Vitamin D deficiency, unspecified: Secondary | ICD-10-CM

## 2022-07-06 MED ORDER — PREGABALIN 50 MG PO CAPS: ORAL_CAPSULE | ORAL | 0 refills | Status: DC

## 2022-07-27 ENCOUNTER — Other Ambulatory Visit: Payer: Self-pay | Admitting: Nurse Practitioner

## 2022-07-27 DIAGNOSIS — M5440 Lumbago with sciatica, unspecified side: Secondary | ICD-10-CM

## 2022-08-02 ENCOUNTER — Ambulatory Visit: Payer: 59 | Admitting: Family Medicine

## 2022-08-06 ENCOUNTER — Ambulatory Visit: Payer: 59 | Admitting: Family Medicine

## 2022-08-06 ENCOUNTER — Encounter: Payer: Self-pay | Admitting: Family Medicine

## 2022-08-06 ENCOUNTER — Other Ambulatory Visit: Payer: Self-pay | Admitting: Family Medicine

## 2022-08-06 VITALS — BP 170/91 | HR 79 | Temp 97.7°F | Ht 73.0 in | Wt 286.8 lb

## 2022-08-06 DIAGNOSIS — M5440 Lumbago with sciatica, unspecified side: Secondary | ICD-10-CM | POA: Diagnosis not present

## 2022-08-06 DIAGNOSIS — I10 Essential (primary) hypertension: Secondary | ICD-10-CM

## 2022-08-06 DIAGNOSIS — Z7689 Persons encountering health services in other specified circumstances: Secondary | ICD-10-CM

## 2022-08-06 MED ORDER — AMLODIPINE BESYLATE 10 MG PO TABS: 10.0000 mg | ORAL_TABLET | Freq: Every day | ORAL | 1 refills | Status: DC

## 2022-08-06 MED ORDER — CYCLOBENZAPRINE HCL 5 MG PO TABS: 5.0000 mg | ORAL_TABLET | Freq: Two times a day (BID) | ORAL | 1 refills | Status: DC | PRN

## 2022-08-06 MED ORDER — PREGABALIN 50 MG PO CAPS: ORAL_CAPSULE | ORAL | 1 refills | Status: DC

## 2022-08-06 MED ORDER — MELOXICAM 15 MG PO TABS: ORAL_TABLET | ORAL | 1 refills | Status: DC

## 2022-08-06 MED ORDER — LISINOPRIL 30 MG PO TABS: 30.0000 mg | ORAL_TABLET | Freq: Every day | ORAL | 1 refills | Status: DC

## 2022-08-06 NOTE — Progress Notes (Signed)
New Patient Office Visit  Subjective    Patient ID: Cameron Hunter, male    DOB: 1970/07/02  Age: 52 y.o. MRN: [id number]  CC:  Chief Complaint  Patient presents with   Establish Care   Neurologic Problem    Post back surgery-Lyrica refill.     Hypertension    HPI Cameron Hunter presents to establish care. Pt is new to me.  He has hx of HTN. He is taking Amlodipine 36m and Lisinopril 348mdaily. He reports his blood pressures have been good at home. He does report increasing weight since November due to the holidays. He does take Mobic 15 mg for his back pain. Has hx of lumbar surgery in 2023. He also is using flexeril prn and lyrica 5046m tabs po bid.  He needs refills of these medicines. He use to see [facility] but hasn't seen them since November. No longer taking Mounjaro. He states he will get back in to re-establish care. Did a cologuard kit but unable to process. He just received a new kit in the mail and plans to do.  Outpatient Encounter Medications as of 08/06/2022  Medication Sig   Vitamin D, Ergocalciferol, (DRISDOL) 1.25 MG (50000 UNIT) CAPS capsule Take 1 capsule (50,000 Units total) by mouth every 7 (seven) days.   [DISCONTINUED] amLODipine (NORVASC) 10 MG tablet TAKE 1 TABLET BY MOUTH DAILY.   [DISCONTINUED] cyclobenzaprine (FLEXERIL) 5 MG tablet Bid   [DISCONTINUED] lisinopril (ZESTRIL) 30 MG tablet Take 1 tablet (30 mg total) by mouth daily.   [DISCONTINUED] meloxicam (MOBIC) 15 MG tablet TAKE 1 TABLET BY MOUTH DAILY.   [DISCONTINUED] pregabalin (LYRICA) 50 MG capsule TAKE 2 CAPSULES BY MOUTH 2 TIMES A DAY   amLODipine (NORVASC) 10 MG tablet Take 1 tablet (10 mg total) by mouth daily.   cyclobenzaprine (FLEXERIL) 5 MG tablet Take 1 tablet (5 mg total) by mouth 2 (two) times daily as needed for muscle spasms. Bid   lisinopril (ZESTRIL) 30 MG tablet Take 1 tablet (30 mg total) by mouth daily.   meloxicam (MOBIC) 15 MG tablet TAKE 1 TABLET BY MOUTH  DAILY.   pregabalin (LYRICA) 50 MG capsule TAKE 2 CAPSULES BY MOUTH 2 TIMES A DAY   [DISCONTINUED] Acetaminophen (TYLENOL PO) Take by mouth. (Patient not taking: Reported on 08/06/2022)   [DISCONTINUED] indomethacin (INDOCIN) 50 MG capsule Take 1 capsule (50 mg total) by mouth 3 (three) times daily as needed. (Patient not taking: Reported on 08/06/2022)   [DISCONTINUED] tirzepatide (MOUNJARO) 5 MG/0.5ML Pen INJECT 5 MG INTO THE SKIN ONCE A WEEK. (Patient not taking: Reported on 08/06/2022)   No facility-administered encounter medications on file as of 08/06/2022.    Past Medical History:  Diagnosis Date   Anxiety    Chronic fatigue syndrome    Gout    High blood pressure    Joint pain    Knee pain    Right knee    Knee pain     Past Surgical History:  Procedure Laterality Date   BACK SURGERY  10/2021    Family History  Problem Relation Age of Onset   Lung cancer Mother    Diabetes Mother    COPD Mother    Heart disease Mother    Kidney disease Mother    Thyroid disease Mother    Obesity Mother     Social History   Socioeconomic History   Marital status: Married    Spouse name: Not on file  Number of children: Not on file   Years of education: Not on file   Highest education level: Not on file  Occupational History   Not on file  Tobacco Use   Smoking status: Never   Smokeless tobacco: Never  Vaping Use   Vaping Use: Never used  Substance and Sexual Activity   Alcohol use: Not Currently   Drug use: Never   Sexual activity: Not on file  Other Topics Concern   Not on file  Social History Narrative   Right handed    Caffeine occasional     Lives with wife at home    Social Determinants of Health   Financial Resource Strain: Not on file  Food Insecurity: Not on file  Transportation Needs: Not on file  Physical Activity: Not on file  Stress: Not on file  Social Connections: Not on file  Intimate Partner Violence: Not on file    Review of Systems  All  other systems reviewed and are negative.       Objective    BP (!) 170/91 (BP Location: Left Arm, Patient Position: Sitting, Cuff Size: Large)   Pulse 79   Temp 97.7 F (36.5 C) (Oral)   Ht 6' 1"$  (1.854 m)   Wt 286 lb 12.8 oz (130.1 kg)   SpO2 98%   BMI 37.84 kg/m   Physical Exam Vitals and nursing note reviewed.  Constitutional:      Appearance: Normal appearance. He is obese.  HENT:     Head: Normocephalic and atraumatic.     Right Ear: External ear normal.     Left Ear: External ear normal.     Nose: Nose normal.     Mouth/Throat:     Mouth: Mucous membranes are moist.     Pharynx: Oropharynx is clear.  Cardiovascular:     Rate and Rhythm: Normal rate and regular rhythm.     Pulses: Normal pulses.     Heart sounds: Normal heart sounds.  Pulmonary:     Effort: Pulmonary effort is normal.     Breath sounds: Normal breath sounds.  Skin:    General: Skin is warm.  Neurological:     General: No focal deficit present.     Mental Status: He is alert and oriented to person, place, and time. Mental status is at baseline.  Psychiatric:        Mood and Affect: Mood normal.        Behavior: Behavior normal.        Thought Content: Thought content normal.        Judgment: Judgment normal.        Assessment & Plan:   Problem List Items Addressed This Visit       Cardiovascular and Mediastinum   Primary hypertension   Relevant Medications   lisinopril (ZESTRIL) 30 MG tablet   amLODipine (NORVASC) 10 MG tablet   Other Visit Diagnoses     Encounter to establish care with new doctor    -  Primary   Lumbago of lumbar region with sciatica       Relevant Medications   cyclobenzaprine (FLEXERIL) 5 MG tablet   meloxicam (MOBIC) 15 MG tablet   pregabalin (LYRICA) 50 MG capsule     Advised to monitor blood pressures outside the office. Diet and exercise. See back in 6 weeks for annual with fasting labs Refilled chronic medicines for HTN and lumbar DDD To  re-establish care with [facility].  Return  in about 6 weeks (around 09/17/2022) for Annual Physical.   Leeanne Rio, MD

## 2022-09-26 ENCOUNTER — Encounter: Payer: 59 | Admitting: Family Medicine

## 2022-10-02 ENCOUNTER — Encounter: Payer: Self-pay | Admitting: Family Medicine

## 2022-10-02 DIAGNOSIS — M5440 Lumbago with sciatica, unspecified side: Secondary | ICD-10-CM

## 2022-10-03 MED ORDER — PREGABALIN 50 MG PO CAPS: ORAL_CAPSULE | ORAL | 1 refills | Status: DC

## 2022-10-04 ENCOUNTER — Other Ambulatory Visit: Payer: Self-pay | Admitting: Family Medicine

## 2022-10-04 DIAGNOSIS — I10 Essential (primary) hypertension: Secondary | ICD-10-CM

## 2022-10-26 ENCOUNTER — Ambulatory Visit (INDEPENDENT_AMBULATORY_CARE_PROVIDER_SITE_OTHER): Payer: 59 | Admitting: Family Medicine

## 2022-10-26 ENCOUNTER — Encounter: Payer: Self-pay | Admitting: Family Medicine

## 2022-10-26 VITALS — BP 138/80 | HR 83 | Temp 98.3°F | Resp 18 | Ht 73.0 in | Wt 288.1 lb

## 2022-10-26 DIAGNOSIS — Z0000 Encounter for general adult medical examination without abnormal findings: Secondary | ICD-10-CM

## 2022-10-26 DIAGNOSIS — I10 Essential (primary) hypertension: Secondary | ICD-10-CM

## 2022-10-26 DIAGNOSIS — R7302 Impaired glucose tolerance (oral): Secondary | ICD-10-CM

## 2022-10-26 DIAGNOSIS — Z125 Encounter for screening for malignant neoplasm of prostate: Secondary | ICD-10-CM

## 2022-10-26 DIAGNOSIS — Z1159 Encounter for screening for other viral diseases: Secondary | ICD-10-CM

## 2022-10-26 DIAGNOSIS — Z13220 Encounter for screening for lipoid disorders: Secondary | ICD-10-CM

## 2022-10-26 DIAGNOSIS — Z1211 Encounter for screening for malignant neoplasm of colon: Secondary | ICD-10-CM

## 2022-10-26 NOTE — Progress Notes (Signed)
Complete physical exam  Patient: Cameron Hunter   DOB: July 30, 1970   52 y.o. Male  MRN: [id number]  Subjective:    Chief Complaint  Patient presents with   Annual Exam    Patient states that he doesn't have any issues today.    Cameron Hunter is a 52 y.o. male who presents today for a complete physical exam. He reports consuming a general diet. The patient has a physically strenuous job, but has no regular exercise apart from work.  He generally feels well. He reports sleeping well. He does not have additional problems to discuss today.   Pt has been checking his blood pressure at home. Last one is 147/70. Most recent fall risk assessment:    08/06/2022    4:10 PM  Fall Risk   Falls in the past year? 0  Number falls in past yr: 0  Injury with Fall? 0  Risk for fall due to : No Fall Risks  Follow up Falls evaluation completed     Most recent depression screenings:    08/06/2022    4:10 PM 11/21/2021    2:15 PM  PHQ 2/9 Scores  PHQ - 2 Score 0 2  PHQ- 9 Score 1 7    Vision:Within last year  Patient Active Problem List   Diagnosis Date Noted   Elevated cholesterol 12/05/2021   Elevated liver enzymes 11/22/2021   Gout 11/21/2021   Arthritis 11/21/2021   Vitamin D deficiency 11/21/2021   Primary hypertension 01/04/2021   Lumbar radiculopathy 05/14/2018   Past Medical History:  Diagnosis Date   Anxiety    Chronic fatigue syndrome    Gout    High blood pressure    Joint pain    Knee pain    Right knee    Knee pain    Family Status  Relation Name Status   Mother  Deceased   Father  Deceased   MGM  Deceased   MGF  Deceased   PGM  Deceased   PGF  Deceased   Family History  Problem Relation Age of Onset   Lung cancer Mother    Diabetes Mother    COPD Mother    Heart disease Mother    Kidney disease Mother    Thyroid disease Mother    Obesity Mother       Patient Care Team: Suzan Slick, MD as PCP - General (Family Medicine)   Outpatient  Medications Prior to Visit  Medication Sig   amLODipine (NORVASC) 10 MG tablet Take 1 tablet (10 mg total) by mouth daily.   cyclobenzaprine (FLEXERIL) 5 MG tablet Take 1 tablet (5 mg total) by mouth 2 (two) times daily as needed for muscle spasms. Bid   lisinopril (ZESTRIL) 30 MG tablet TAKE 1 TABLET (30 MG TOTAL) BY MOUTH DAILY.   meloxicam (MOBIC) 15 MG tablet TAKE 1 TABLET BY MOUTH DAILY.   pregabalin (LYRICA) 50 MG capsule TAKE 2 CAPSULES BY MOUTH 2 TIMES A DAY   [DISCONTINUED] Vitamin D, Ergocalciferol, (DRISDOL) 1.25 MG (50000 UNIT) CAPS capsule Take 1 capsule (50,000 Units total) by mouth every 7 (seven) days. (Patient not taking: Reported on 10/26/2022)   No facility-administered medications prior to visit.    Review of Systems  All other systems reviewed and are negative.         Objective:     BP 138/80   Pulse 83   Temp 98.3 F (36.8 C) (Oral)   Resp 18   Ht  6\' 1"  (1.854 m)   Wt 288 lb 1.6 oz (130.7 kg)   SpO2 100%   BMI 38.01 kg/m  BP Readings from Last 3 Encounters:  10/26/22 138/80  08/06/22 (!) 170/91  05/08/22 130/75      Physical Exam Vitals and nursing note reviewed.  Constitutional:      Appearance: Normal appearance. He is normal weight.  HENT:     Head: Normocephalic and atraumatic.     Right Ear: External ear normal.     Left Ear: External ear normal.     Nose: Nose normal.     Mouth/Throat:     Mouth: Mucous membranes are moist.     Pharynx: Oropharynx is clear.  Eyes:     Conjunctiva/sclera: Conjunctivae normal.     Pupils: Pupils are equal, round, and reactive to light.  Cardiovascular:     Rate and Rhythm: Normal rate and regular rhythm.     Pulses: Normal pulses.     Heart sounds: Normal heart sounds.  Pulmonary:     Effort: Pulmonary effort is normal.     Breath sounds: Normal breath sounds.  Abdominal:     General: Abdomen is flat. Bowel sounds are normal.  Skin:    General: Skin is warm.     Capillary Refill: Capillary  refill takes less than 2 seconds.  Neurological:     General: No focal deficit present.     Mental Status: He is alert and oriented to person, place, and time. Mental status is at baseline.  Psychiatric:        Mood and Affect: Mood normal.        Behavior: Behavior normal.        Thought Content: Thought content normal.        Judgment: Judgment normal.      No results found for any visits on 10/26/22.      Assessment & Plan:    Routine Health Maintenance and Physical Exam  Immunization History  Administered Date(s) Administered   PFIZER(Purple Top)SARS-COV-2 Vaccination 08/22/2019, 09/12/2019   Tdap 03/24/2012    Health Maintenance  Topic Date Due   HIV Screening  Never done   Hepatitis C Screening  Never done   COLONOSCOPY (Pts 45-6yrs Insurance coverage will need to be confirmed)  Never done   Zoster Vaccines- Shingrix (1 of 2) Never done   DTaP/Tdap/Td (2 - Td or Tdap) 03/24/2022   HEMOGLOBIN A1C  11/06/2022   INFLUENZA VACCINE  01/17/2023   OPHTHALMOLOGY EXAM  03/13/2023   Diabetic kidney evaluation - Urine ACR  03/23/2023   Diabetic kidney evaluation - eGFR measurement  05/09/2023   FOOT EXAM  10/26/2023   HPV VACCINES  Aged Out   COVID-19 Vaccine  Discontinued    Discussed health benefits of physical activity, and encouraged him to engage in regular exercise appropriate for his age and condition.  Problem List Items Addressed This Visit       Cardiovascular and Mediastinum   Primary hypertension   Other Visit Diagnoses     Annual physical exam    -  Primary   Impaired glucose tolerance       Relevant Orders   CBC with Differential/Platelet   Comprehensive metabolic panel   Hemoglobin A1c   Encounter for lipid screening for cardiovascular disease       Relevant Orders   Lipid panel   Screening PSA (prostate specific antigen)       Relevant Orders   PSA  Screening for colon cancer       Relevant Orders   Ambulatory referral to Gastroenterology    Screening for viral disease       Relevant Orders   Hepatitis C antibody   HIV Antibody (routine testing w rflx)      Return in about 6 months (around 04/28/2023) for Hypertension. Annual physical exam  Impaired glucose tolerance -     CBC with Differential/Platelet -     Comprehensive metabolic panel -     Hemoglobin A1c  Encounter for lipid screening for cardiovascular disease -     Lipid panel  Screening PSA (prostate specific antigen) -     PSA  Screening for colon cancer -     Ambulatory referral to Gastroenterology  Screening for viral disease -     Hepatitis C antibody -     HIV Antibody (routine testing w rflx)  Primary hypertension   Screening labs Refer to colonoscopy See back in 6 months for HTN follow up    Suzan Slick, MD

## 2022-10-27 LAB — COMPREHENSIVE METABOLIC PANEL
ALT: 38 IU/L (ref 0–44)
AST: 33 IU/L (ref 0–40)
Albumin/Globulin Ratio: 1.8 (ref 1.2–2.2)
Albumin: 4.9 g/dL (ref 3.8–4.9)
Alkaline Phosphatase: 71 IU/L (ref 44–121)
BUN/Creatinine Ratio: 18 (ref 9–20)
BUN: 20 mg/dL (ref 6–24)
Bilirubin Total: 0.9 mg/dL (ref 0.0–1.2)
CO2: 20 mmol/L (ref 20–29)
Calcium: 9.6 mg/dL (ref 8.7–10.2)
Chloride: 100 mmol/L (ref 96–106)
Creatinine, Ser: 1.1 mg/dL (ref 0.76–1.27)
Globulin, Total: 2.7 g/dL (ref 1.5–4.5)
Glucose: 125 mg/dL — ABNORMAL HIGH (ref 70–99)
Potassium: 4.4 mmol/L (ref 3.5–5.2)
Sodium: 140 mmol/L (ref 134–144)
Total Protein: 7.6 g/dL (ref 6.0–8.5)
eGFR: 80 mL/min/{1.73_m2} (ref >59)

## 2022-10-27 LAB — CBC WITH DIFFERENTIAL/PLATELET
Basophils Absolute: 0 10*3/uL (ref 0.0–0.2)
Basos: 1 %
EOS (ABSOLUTE): 0.1 10*3/uL (ref 0.0–0.4)
Eos: 2 %
Hematocrit: 48.4 % (ref 37.5–51.0)
Hemoglobin: 16.5 g/dL (ref 13.0–17.7)
Immature Grans (Abs): 0 10*3/uL (ref 0.0–0.1)
Immature Granulocytes: 0 %
Lymphocytes Absolute: 1.3 10*3/uL (ref 0.7–3.1)
Lymphs: 31 %
MCH: 28.8 pg (ref 26.6–33.0)
MCHC: 34.1 g/dL (ref 31.5–35.7)
MCV: 85 fL (ref 79–97)
Monocytes Absolute: 0.3 10*3/uL (ref 0.1–0.9)
Monocytes: 8 %
Neutrophils Absolute: 2.5 10*3/uL (ref 1.4–7.0)
Neutrophils: 58 %
Platelets: 222 10*3/uL (ref 150–450)
RBC: 5.73 x10E6/uL (ref 4.14–5.80)
RDW: 13.4 % (ref 11.6–15.4)
WBC: 4.3 10*3/uL (ref 3.4–10.8)

## 2022-10-27 LAB — PSA: Prostate Specific Ag, Serum: 0.5 ng/mL (ref 0.0–4.0)

## 2022-10-27 LAB — LIPID PANEL
Chol/HDL Ratio: 4.7 ratio (ref 0.0–5.0)
Cholesterol, Total: 201 mg/dL — ABNORMAL HIGH (ref 100–199)
HDL: 43 mg/dL (ref >39)
LDL Chol Calc (NIH): 132 mg/dL — ABNORMAL HIGH (ref 0–99)
Triglycerides: 143 mg/dL (ref 0–149)
VLDL Cholesterol Cal: 26 mg/dL (ref 5–40)

## 2022-10-27 LAB — HEMOGLOBIN A1C
Est. average glucose Bld gHb Est-mCnc: 140 mg/dL
Hgb A1c MFr Bld: 6.5 % — ABNORMAL HIGH (ref 4.8–5.6)

## 2022-10-29 ENCOUNTER — Other Ambulatory Visit: Payer: Self-pay | Admitting: Family Medicine

## 2022-10-29 ENCOUNTER — Encounter: Payer: Self-pay | Admitting: Family Medicine

## 2022-10-29 DIAGNOSIS — R7302 Impaired glucose tolerance (oral): Secondary | ICD-10-CM

## 2022-10-29 MED ORDER — METFORMIN HCL ER 500 MG PO TB24
500.0000 mg | ORAL_TABLET | Freq: Every day | ORAL | 1 refills | Status: DC
Start: 2022-10-29 — End: 2023-04-22

## 2022-10-31 LAB — HIV-1/HIV-2 QUALITATIVE RNA
Final Interpretation: NEGATIVE
HIV-1 RNA, Qualitative: NONREACTIVE
HIV-2 RNA, Qualitative: NONREACTIVE

## 2022-10-31 LAB — HIV 1/2 AB DIFFERENTIATION
HIV 1 Ab: NONREACTIVE
HIV 2 Ab: NONREACTIVE
NOTE (HIV CONF MULTIP: NEGATIVE

## 2022-10-31 LAB — HIV ANTIBODY (ROUTINE TESTING W REFLEX): HIV Screen 4th Generation wRfx: REACTIVE

## 2022-10-31 LAB — HEPATITIS C ANTIBODY: Hep C Virus Ab: NONREACTIVE

## 2022-11-28 ENCOUNTER — Encounter: Payer: Self-pay | Admitting: Family Medicine

## 2022-11-28 DIAGNOSIS — M5440 Lumbago with sciatica, unspecified side: Secondary | ICD-10-CM

## 2022-11-29 MED ORDER — PREGABALIN 100 MG PO CAPS: 100.0000 mg | ORAL_CAPSULE | Freq: Two times a day (BID) | ORAL | 3 refills | Status: DC

## 2023-01-16 ENCOUNTER — Other Ambulatory Visit: Payer: Self-pay | Admitting: Family Medicine

## 2023-01-16 DIAGNOSIS — I10 Essential (primary) hypertension: Secondary | ICD-10-CM

## 2023-01-22 IMAGING — CT CT L SPINE W/ CM
1 of 7 series · 6 of 14 positions shown, 8 images · non-contrast
Comparison: Lumbar spine MRI 07/31/2021

CLINICAL DATA: Anterior right knee pain and some lateral right
thigh pain. No improvement following recent lumbar epidural and
right knee injections.
TECHNIQUE: Contiguous axial images were obtained through the Lumbar spine after
the intrathecal infusion of contrast. Coronal and sagittal
reconstructions were obtained of the axial image sets.

[Series 3: l spine soft · axial · 0.38mm/px · z∈[-772,-592]mm · 6 of 127 slices shown, 8 images]
[im 19/127  soft-tissue]
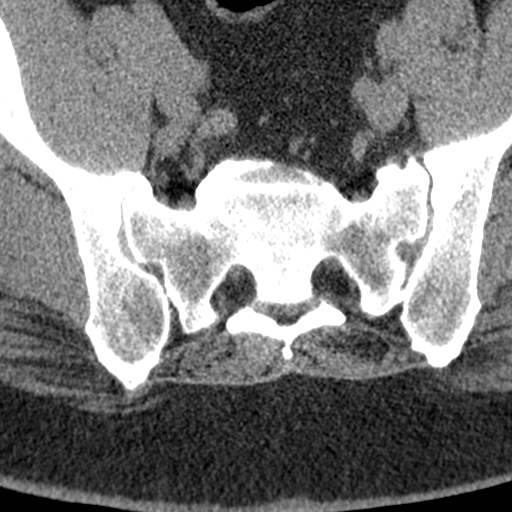
[im 19/127  bone]
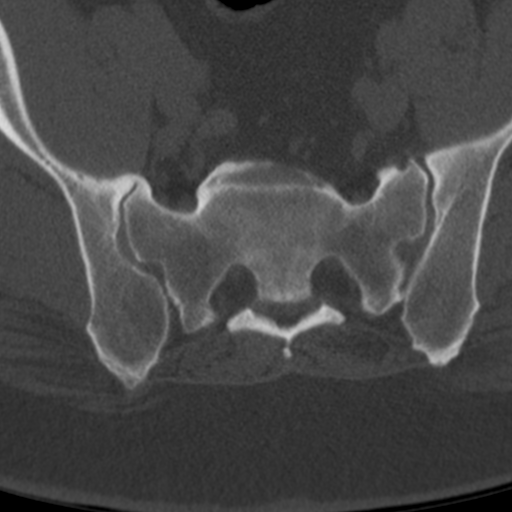
[im 37/127  bone]
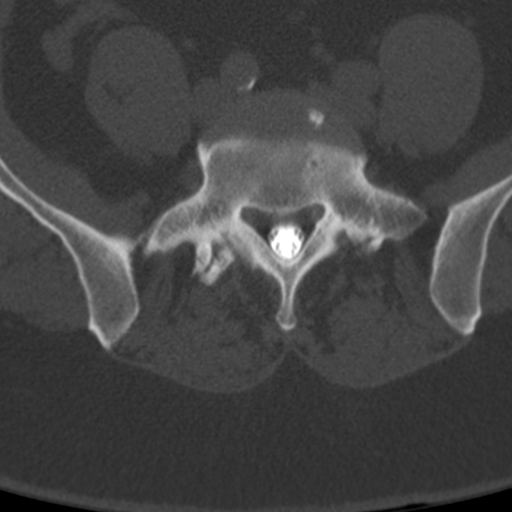
[im 55/127  bone]
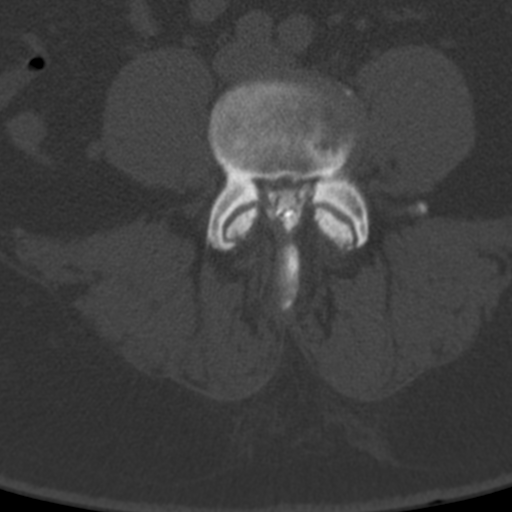
[im 73/127  bone]
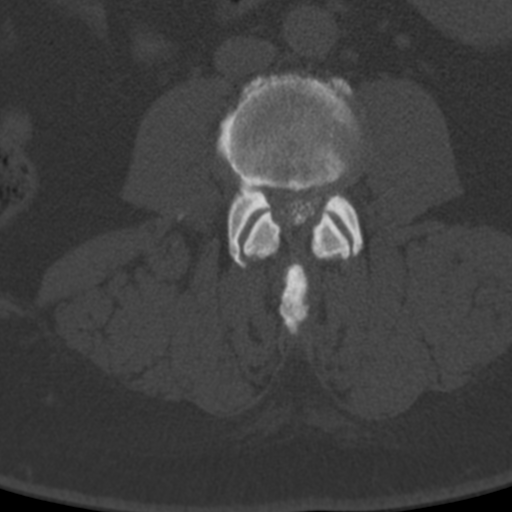
[im 91/127  soft-tissue]
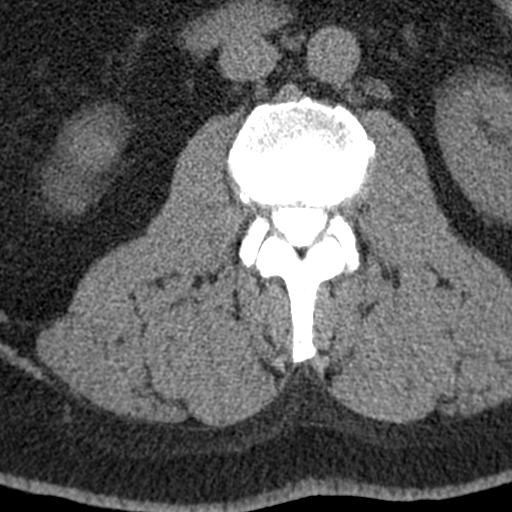
[im 91/127  bone]
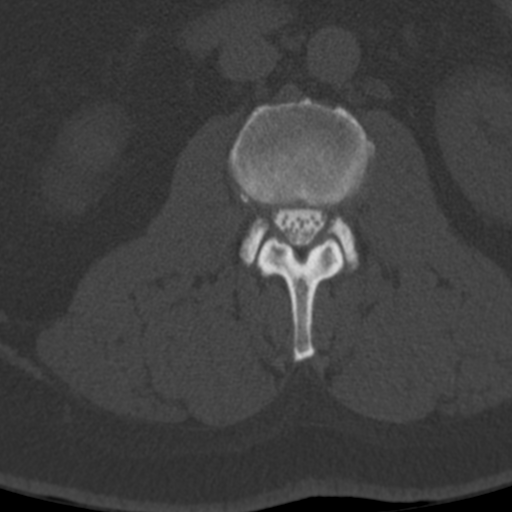
[im 109/127  bone]
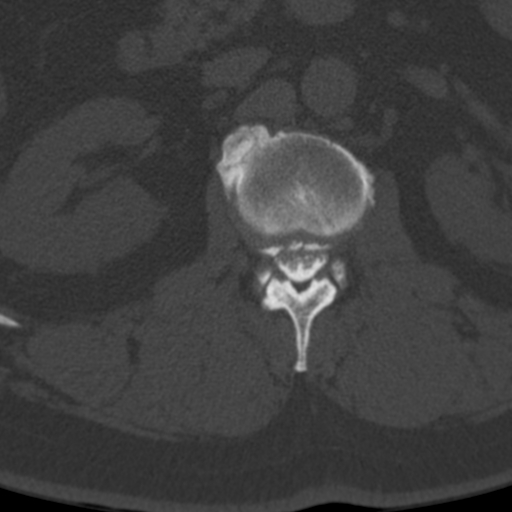

[6 of 14 positions shown; findings below may reference images not displayed]

EXAM:
LUMBAR MYELOGRAM

FLUOROSCOPY:
Fluoroscopy Time: 17 seconds

Radiation Exposure Index: 1.90 mGy

PROCEDURE:
After thorough discussion of risks and benefits of the procedure
including bleeding, infection, injury to nerves, blood vessels,
adjacent structures as well as headache and CSF leak, written and
oral informed consent was obtained. Consent was obtained by Dr.
Syafeeq Atif. Time out form was completed.

Patient was positioned prone on the fluoroscopy table. Local
anesthesia was provided with 1% lidocaine without epinephrine after
prepped and draped in the usual sterile fashion. Puncture was
performed at L5-S1 using a 5 inch 22-gauge spinal needle via a left
interlaminar approach. Using a single pass through the dura, the
needle was placed within the thecal sac, with return of clear CSF.
15 mL of Isovue B-[id number] was injected into the thecal sac, with normal
opacification of the nerve roots and cauda equina consistent with
free flow within the subarachnoid space.

I personally performed the lumbar puncture and administered the
intrathecal contrast. I also personally supervised acquisition of
the myelogram images.
FINDINGS: LUMBAR MYELOGRAM FINDINGS:

Transitional lumbosacral anatomy is again noted with partial
lumbarization of S1. Vertebral alignment is near anatomic, and no
abnormal motion is evident on flexion or extension radiographs. A
ventral extradural defect is present at L3-4, and there is evidence
of asymmetric right lateral recess stenosis with partial effacement
of the right L4 nerve root. No significant spinal stenosis is
evident elsewhere.

CT LUMBAR MYELOGRAM FINDINGS:

There is slight left convex curvature of the lumbar spine, and there
is subtle retrolisthesis of L3 on L4 and L4 on L5. No fracture or
suspicious osseous lesion is identified. Disc space narrowing is
moderate at L3-4 and mild at L4-5 with associated degenerative
endplate changes at these levels. The conus medullaris terminates at
L1. There is mild abdominal aortic atherosclerosis without aneurysm.

L1-2: A broad-based posterior disc osteophyte complex and mild facet
hypertrophy result in mild spinal stenosis and mild right lateral
recess stenosis without neural foraminal stenosis, unchanged.

L2-3: Moderate facet hypertrophy without disc herniation or
stenosis, unchanged.

L3-4: Circumferential disc bulging, a right paracentral to
subarticular disc protrusion, endplate spurring, and moderate to
severe right greater than left facet hypertrophy result in moderate
spinal stenosis, severe right greater than left lateral recess
stenosis, and mild-to-moderate right neural foraminal stenosis,
unchanged. Impingement of the right greater than left L4 nerve roots
in the lateral recesses. Disc and osteophyte also contact the
extraforaminal right L3 nerve.

L4-5: Disc bulging, endplate spurring, and moderate facet
hypertrophy result in mild-to-moderate bilateral lateral recess
stenosis and mild-to-moderate bilateral neural foraminal stenosis
without significant spinal stenosis, unchanged.

L5-S1: Disc bulging, endplate spurring, and markedly severe facet
hypertrophy result in mild bilateral lateral recess stenosis and
moderate bilateral neural foraminal stenosis without spinal
stenosis, unchanged.

S1-2: Rudimentary disc. Endplate and facet spurring result in mild
right and mild-to-moderate left neural foraminal stenosis without
spinal stenosis, unchanged.
IMPRESSION: 1. Unchanged lumbar disc and facet degeneration, most notable at
L3-4 where there is asymmetrically severe right lateral recess
stenosis with right L4 nerve root impingement.
2. Mild-to-moderate lateral recess and neural foraminal stenosis at
L4-5 and L5-S1.
3. Aortic Atherosclerosis ([id number]).

## 2023-01-25 ENCOUNTER — Other Ambulatory Visit: Payer: Self-pay | Admitting: Family Medicine

## 2023-01-25 DIAGNOSIS — M5440 Lumbago with sciatica, unspecified side: Secondary | ICD-10-CM

## 2023-02-21 ENCOUNTER — Other Ambulatory Visit: Payer: Self-pay | Admitting: Family Medicine

## 2023-02-21 DIAGNOSIS — M5440 Lumbago with sciatica, unspecified side: Secondary | ICD-10-CM

## 2023-03-13 ENCOUNTER — Other Ambulatory Visit: Payer: Self-pay | Admitting: Family Medicine

## 2023-03-13 DIAGNOSIS — I10 Essential (primary) hypertension: Secondary | ICD-10-CM

## 2023-03-14 ENCOUNTER — Encounter: Payer: Self-pay | Admitting: Family Medicine

## 2023-03-22 ENCOUNTER — Other Ambulatory Visit: Payer: Self-pay | Admitting: Family Medicine

## 2023-03-22 DIAGNOSIS — M5440 Lumbago with sciatica, unspecified side: Secondary | ICD-10-CM

## 2023-03-26 ENCOUNTER — Other Ambulatory Visit: Payer: Self-pay | Admitting: Family Medicine

## 2023-03-26 DIAGNOSIS — M5440 Lumbago with sciatica, unspecified side: Secondary | ICD-10-CM

## 2023-04-19 ENCOUNTER — Other Ambulatory Visit: Payer: Self-pay | Admitting: Family Medicine

## 2023-04-19 DIAGNOSIS — R7302 Impaired glucose tolerance (oral): Secondary | ICD-10-CM

## 2023-04-24 ENCOUNTER — Other Ambulatory Visit: Payer: Self-pay | Admitting: Family Medicine

## 2023-04-24 DIAGNOSIS — M5440 Lumbago with sciatica, unspecified side: Secondary | ICD-10-CM

## 2023-04-30 ENCOUNTER — Ambulatory Visit: Payer: 59 | Admitting: Family Medicine

## 2023-05-06 ENCOUNTER — Ambulatory Visit: Payer: 59 | Admitting: Family Medicine

## 2023-05-06 VITALS — BP 138/83 | HR 75 | Temp 97.8°F | Resp 18 | Ht 73.0 in | Wt 291.0 lb

## 2023-05-06 DIAGNOSIS — E119 Type 2 diabetes mellitus without complications: Secondary | ICD-10-CM | POA: Diagnosis not present

## 2023-05-06 DIAGNOSIS — I10 Essential (primary) hypertension: Secondary | ICD-10-CM

## 2023-05-06 DIAGNOSIS — Z6838 Body mass index (BMI) 38.0-38.9, adult: Secondary | ICD-10-CM | POA: Diagnosis not present

## 2023-05-06 MED ORDER — AMLODIPINE BESYLATE 10 MG PO TABS
10.0000 mg | ORAL_TABLET | Freq: Every day | ORAL | 1 refills | Status: DC
Start: 2023-05-06 — End: 2023-11-14

## 2023-05-06 MED ORDER — LISINOPRIL 30 MG PO TABS: 30.0000 mg | ORAL_TABLET | Freq: Every day | ORAL | 1 refills | Status: DC

## 2023-05-06 NOTE — Patient Instructions (Signed)
Tdap, Shingrix, Flu and Covid

## 2023-05-06 NOTE — Progress Notes (Signed)
Established Patient Office Visit  Subjective   Patient ID: Cameron Hunter, male    DOB: 11/24/1970  Age: 52 y.o. MRN: [id number]  Chief Complaint  Patient presents with   Medical Management of Chronic Issues    Patient is here for a follow up for HTN    HPI  Hypertension Pt is taking Lisinopril 30 mg and Amlodipine 10mg  daily. He reports he has white coat syndrome. His home readings are always at goal.  He needs his Amlodipine and Lisinopril refilled today.  Diabetes He was recently diagnosed with Diabetes with A1c of 6.5 in May. He was started on Metformin 500 mg daily. He reports he use to see [facility] and was on Mounjaro. He was on this for 5 months and lost weight from 323 to 269. He stopped in November. He is interested in restarting Mounjaro to help with weight loss. He plans on getting flu and covid vaccines at work.    Review of Systems  Constitutional:  Negative for malaise/fatigue (weight gain).  All other systems reviewed and are negative.     Objective:     BP 138/83 Comment: home reading  Pulse 75   Temp 97.8 F (36.6 C) (Oral)   Resp 18   Ht 6\' 1"  (1.854 m)   Wt 291 lb (132 kg)   SpO2 99%   BMI 38.39 kg/m  BP Readings from Last 3 Encounters:  05/06/23 138/83  10/26/22 138/80  08/06/22 (!) 170/91      Physical Exam Vitals and nursing note reviewed.  Constitutional:      Appearance: Normal appearance. He is obese.  HENT:     Head: Normocephalic and atraumatic.     Right Ear: External ear normal.     Left Ear: External ear normal.     Nose: Nose normal.     Mouth/Throat:     Mouth: Mucous membranes are moist.     Pharynx: Oropharynx is clear.  Eyes:     Conjunctiva/sclera: Conjunctivae normal.     Pupils: Pupils are equal, round, and reactive to light.  Cardiovascular:     Rate and Rhythm: Normal rate.  Pulmonary:     Effort: Pulmonary effort is normal.  Skin:    General: Skin is warm.     Capillary Refill: Capillary refill takes less than 2  seconds.  Neurological:     General: No focal deficit present.     Mental Status: He is alert and oriented to person, place, and time. Mental status is at baseline.  Psychiatric:        Mood and Affect: Mood normal.        Behavior: Behavior normal.        Thought Content: Thought content normal.        Judgment: Judgment normal.     No results found for any visits on 05/06/23.  Last metabolic panel Lab Results  Component Value Date   GLUCOSE 125 (H) 10/26/2022   NA 140 10/26/2022   K 4.4 10/26/2022   CL 100 10/26/2022   CO2 20 10/26/2022   BUN 20 10/26/2022   CREATININE 1.10 10/26/2022   EGFR 80 10/26/2022   CALCIUM 9.6 10/26/2022   PROT 7.6 10/26/2022   ALBUMIN 4.9 10/26/2022   LABGLOB 2.7 10/26/2022   AGRATIO 1.8 10/26/2022   BILITOT 0.9 10/26/2022   ALKPHOS 71 10/26/2022   AST 33 10/26/2022   ALT 38 10/26/2022   Last hemoglobin A1c Lab Results  Component Value Date  HGBA1C 6.5 (H) 10/26/2022      The 10-year ASCVD risk score (Arnett DK, et al., 2019) is: 13.4%    Assessment & Plan:   Problem List Items Addressed This Visit       Cardiovascular and Mediastinum   Primary hypertension - Primary   Relevant Medications   amLODipine (NORVASC) 10 MG tablet   lisinopril (ZESTRIL) 30 MG tablet   Other Visit Diagnoses     White coat syndrome with diagnosis of hypertension       Relevant Medications   amLODipine (NORVASC) 10 MG tablet   lisinopril (ZESTRIL) 30 MG tablet   BMI 38.0-38.9,adult       Type 2 diabetes mellitus without complication, without long-term current use of insulin (HCC)       Relevant Medications   lisinopril (ZESTRIL) 30 MG tablet   Other Relevant Orders   Hemoglobin A1c   Basic metabolic panel   Microalbumin / creatinine urine ratio      Primary hypertension -     amLODIPine Besylate; Take 1 tablet (10 mg total) by mouth daily.  Dispense: 90 tablet; Refill: 1 -     Lisinopril; Take 1 tablet (30 mg total) by mouth daily.   Dispense: 90 tablet; Refill: 1  White coat syndrome with diagnosis of hypertension  BMI 38.0-38.9,adult  Type 2 diabetes mellitus without complication, without long-term current use of insulin (HCC) -     Hemoglobin A1c -     Basic metabolic panel -     Microalbumin / creatinine urine ratio   Pt is stable on blood pressure medicines. Continue regimen and continue checking blood pressure at home. Refilled today. To recheck labs and A1c today. Due to weight gain and BMI, may send in Ste[place].                                                              Return in about 25 weeks (around 10/28/2023) for Annual Physical.    Suzan Slick, MD

## 2023-05-07 ENCOUNTER — Other Ambulatory Visit: Payer: Self-pay | Admitting: Family Medicine

## 2023-05-07 DIAGNOSIS — Z6838 Body mass index (BMI) 38.0-38.9, adult: Secondary | ICD-10-CM

## 2023-05-07 DIAGNOSIS — R7303 Prediabetes: Secondary | ICD-10-CM

## 2023-05-07 DIAGNOSIS — I10 Essential (primary) hypertension: Secondary | ICD-10-CM

## 2023-05-07 LAB — BASIC METABOLIC PANEL
BUN/Creatinine Ratio: 17 (ref 9–20)
BUN: 19 mg/dL (ref 6–24)
CO2: 22 mmol/L (ref 20–29)
Calcium: 9.9 mg/dL (ref 8.7–10.2)
Chloride: 103 mmol/L (ref 96–106)
Creatinine, Ser: 1.14 mg/dL (ref 0.76–1.27)
Glucose: 115 mg/dL — ABNORMAL HIGH (ref 70–99)
Potassium: 4.7 mmol/L (ref 3.5–5.2)
Sodium: 141 mmol/L (ref 134–144)
eGFR: 77 mL/min/{1.73_m2} (ref >59)

## 2023-05-07 LAB — HEMOGLOBIN A1C
Est. average glucose Bld gHb Est-mCnc: 137 mg/dL
Hgb A1c MFr Bld: 6.4 % — ABNORMAL HIGH (ref 4.8–5.6)

## 2023-05-07 LAB — MICROALBUMIN / CREATININE URINE RATIO
Creatinine, Urine: 404.9 mg/dL
Microalb/Creat Ratio: 51 mg/g{creat} — ABNORMAL HIGH (ref 0–29)
Microalbumin, Urine: 206 ug/mL

## 2023-05-07 MED ORDER — MOUNJARO 5 MG/0.5ML ~~LOC~~ SOAJ: 5.0000 mg | SUBCUTANEOUS | 0 refills | Status: DC

## 2023-05-21 ENCOUNTER — Other Ambulatory Visit: Payer: Self-pay | Admitting: Family Medicine

## 2023-05-21 DIAGNOSIS — M5440 Lumbago with sciatica, unspecified side: Secondary | ICD-10-CM

## 2023-06-04 ENCOUNTER — Other Ambulatory Visit: Payer: Self-pay | Admitting: Family Medicine

## 2023-06-04 ENCOUNTER — Other Ambulatory Visit (HOSPITAL_COMMUNITY): Payer: Self-pay

## 2023-06-04 ENCOUNTER — Other Ambulatory Visit: Payer: Self-pay

## 2023-06-04 DIAGNOSIS — I10 Essential (primary) hypertension: Secondary | ICD-10-CM

## 2023-06-04 DIAGNOSIS — Z6838 Body mass index (BMI) 38.0-38.9, adult: Secondary | ICD-10-CM

## 2023-06-04 DIAGNOSIS — R7303 Prediabetes: Secondary | ICD-10-CM

## 2023-06-04 MED ORDER — MOUNJARO 5 MG/0.5ML ~~LOC~~ SOAJ
5.0000 mg | SUBCUTANEOUS | 0 refills | Status: DC
  Filled 2023-06-04 – 2023-06-17 (×2): qty 2, 28d supply, fill #0
  Filled 2023-07-08 – 2023-07-11 (×3): qty 2, 28d supply, fill #1
  Filled 2023-08-07: qty 2, 28d supply, fill #2

## 2023-06-05 ENCOUNTER — Other Ambulatory Visit: Payer: Self-pay

## 2023-06-06 ENCOUNTER — Other Ambulatory Visit (HOSPITAL_COMMUNITY): Payer: Self-pay

## 2023-06-11 ENCOUNTER — Other Ambulatory Visit: Payer: Self-pay

## 2023-06-18 ENCOUNTER — Other Ambulatory Visit: Payer: Self-pay

## 2023-06-18 ENCOUNTER — Other Ambulatory Visit: Payer: Self-pay | Admitting: Family Medicine

## 2023-06-18 ENCOUNTER — Other Ambulatory Visit (HOSPITAL_COMMUNITY): Payer: Self-pay

## 2023-06-18 DIAGNOSIS — R7302 Impaired glucose tolerance (oral): Secondary | ICD-10-CM

## 2023-07-02 ENCOUNTER — Encounter: Payer: Self-pay | Admitting: Family Medicine

## 2023-07-02 DIAGNOSIS — M5440 Lumbago with sciatica, unspecified side: Secondary | ICD-10-CM

## 2023-07-02 DIAGNOSIS — Z1211 Encounter for screening for malignant neoplasm of colon: Secondary | ICD-10-CM

## 2023-07-02 MED ORDER — CYCLOBENZAPRINE HCL 5 MG PO TABS: 5.0000 mg | ORAL_TABLET | Freq: Two times a day (BID) | ORAL | 1 refills | Status: DC | PRN

## 2023-07-08 ENCOUNTER — Other Ambulatory Visit (HOSPITAL_COMMUNITY): Payer: Self-pay

## 2023-07-11 ENCOUNTER — Encounter: Payer: Self-pay | Admitting: Family Medicine

## 2023-07-11 ENCOUNTER — Other Ambulatory Visit (HOSPITAL_COMMUNITY): Payer: Self-pay

## 2023-07-15 ENCOUNTER — Other Ambulatory Visit: Payer: Self-pay | Admitting: Family Medicine

## 2023-07-15 DIAGNOSIS — M5440 Lumbago with sciatica, unspecified side: Secondary | ICD-10-CM

## 2023-07-22 ENCOUNTER — Other Ambulatory Visit (HOSPITAL_COMMUNITY): Payer: Self-pay

## 2023-08-08 ENCOUNTER — Other Ambulatory Visit (HOSPITAL_COMMUNITY): Payer: Self-pay

## 2023-08-14 ENCOUNTER — Other Ambulatory Visit: Payer: Self-pay | Admitting: Family Medicine

## 2023-08-14 DIAGNOSIS — R7302 Impaired glucose tolerance (oral): Secondary | ICD-10-CM

## 2023-08-27 ENCOUNTER — Encounter: Payer: Self-pay | Admitting: Family Medicine

## 2023-08-28 ENCOUNTER — Other Ambulatory Visit: Payer: Self-pay

## 2023-08-28 DIAGNOSIS — M5440 Lumbago with sciatica, unspecified side: Secondary | ICD-10-CM

## 2023-08-28 MED ORDER — CYCLOBENZAPRINE HCL 5 MG PO TABS: 5.0000 mg | ORAL_TABLET | Freq: Two times a day (BID) | ORAL | 1 refills | Status: DC | PRN

## 2023-09-12 ENCOUNTER — Other Ambulatory Visit: Payer: Self-pay | Admitting: Family Medicine

## 2023-09-12 DIAGNOSIS — M5440 Lumbago with sciatica, unspecified side: Secondary | ICD-10-CM

## 2023-09-12 DIAGNOSIS — R7303 Prediabetes: Secondary | ICD-10-CM

## 2023-09-12 DIAGNOSIS — I10 Essential (primary) hypertension: Secondary | ICD-10-CM

## 2023-09-12 DIAGNOSIS — Z6838 Body mass index (BMI) 38.0-38.9, adult: Secondary | ICD-10-CM

## 2023-09-12 NOTE — Telephone Encounter (Signed)
 Requesting rx rf of Meloxicam  Last written 07/16/2023 Last OV 04/26/2023 Upcoming appt 10/28/2023 physical

## 2023-09-13 ENCOUNTER — Other Ambulatory Visit: Payer: Self-pay

## 2023-09-13 ENCOUNTER — Other Ambulatory Visit (HOSPITAL_COMMUNITY): Payer: Self-pay

## 2023-09-13 MED ORDER — MOUNJARO 5 MG/0.5ML ~~LOC~~ SOAJ
5.0000 mg | SUBCUTANEOUS | 0 refills | Status: DC
  Filled 2023-09-13: qty 2, 28d supply, fill #0
  Filled 2023-10-04: qty 2, 28d supply, fill #1
  Filled 2023-11-03: qty 2, 28d supply, fill #2

## 2023-09-13 NOTE — Telephone Encounter (Signed)
 Message received  Patient requesting rx rf of Mounjaro 5mg  Last written 06/04/2023 Last OV 05/06/2023 Upcoming appt 10/28/2023

## 2023-09-16 ENCOUNTER — Other Ambulatory Visit (HOSPITAL_COMMUNITY): Payer: Self-pay

## 2023-09-16 ENCOUNTER — Other Ambulatory Visit: Payer: Self-pay

## 2023-09-16 ENCOUNTER — Ambulatory Visit: Admitting: Family Medicine

## 2023-09-16 ENCOUNTER — Encounter: Payer: Self-pay | Admitting: Family Medicine

## 2023-09-17 ENCOUNTER — Encounter: Payer: Self-pay | Admitting: Family Medicine

## 2023-09-17 ENCOUNTER — Ambulatory Visit

## 2023-09-17 ENCOUNTER — Ambulatory Visit: Admitting: Family Medicine

## 2023-09-17 VITALS — BP 139/81 | HR 80 | Temp 97.6°F | Resp 18 | Ht 73.0 in | Wt 282.5 lb

## 2023-09-17 DIAGNOSIS — M25561 Pain in right knee: Secondary | ICD-10-CM

## 2023-09-17 DIAGNOSIS — M25461 Effusion, right knee: Secondary | ICD-10-CM | POA: Diagnosis not present

## 2023-09-17 DIAGNOSIS — G8929 Other chronic pain: Secondary | ICD-10-CM

## 2023-09-17 NOTE — Progress Notes (Signed)
 Established Patient Office Visit  Subjective   Patient ID: Cameron Hunter, male    DOB: March 10, 1971  Age: 53 y.o. MRN: [id number]  Chief Complaint  Patient presents with   Knee Pain    Patient states that Saturday his left  knee started hurting and by Sunday he was in more pain. He states that he  heard a pop in his knee on Monday . Today it doesn't hurt as bad.    Knee Pain   Knee pain Pt reports hx of right knee pain back in 2021. He had issues with his back and had surgery of his back which helped ihs knee pain. He reports Friday night, he had a good time and Saturday, he played golf. Sunday he woke up with a weird feeling in his knee. Putting weight on the knee makes him feel sensitive. He couldn't get comfortable while sleeping at night. He did hear a pop in his knee. He feels like his knee was swollen and feels like it's behind the knee cap. Bending and walking made the pain worse. Pain 8 yesterday.  He hasn't tried anything for the pain. Pt has hx of gout and use to take medicine for this but didn't have recent flares.    Review of Systems  Musculoskeletal:  Positive for joint pain.       Chronic right knee pain  All other systems reviewed and are negative.    Objective:     BP 139/81   Pulse 80   Temp 97.6 F (36.4 C) (Oral)   Resp 18   Ht 6\' 1"  (1.854 m)   Wt 282 lb 8 oz (128.1 kg)   SpO2 98%   BMI 37.27 kg/m  BP Readings from Last 3 Encounters:  09/17/23 139/81  05/06/23 138/83  10/26/22 138/80      Physical Exam Vitals and nursing note reviewed.  Constitutional:      Appearance: Normal appearance. He is normal weight.  HENT:     Head: Normocephalic and atraumatic.     Right Ear: External ear normal.     Left Ear: External ear normal.     Nose: Nose normal.     Mouth/Throat:     Mouth: Mucous membranes are moist.     Pharynx: Oropharynx is clear.  Eyes:     Conjunctiva/sclera: Conjunctivae normal.     Pupils: Pupils are equal, round, and reactive to  light.  Cardiovascular:     Rate and Rhythm: Normal rate.  Pulmonary:     Effort: Pulmonary effort is normal.  Musculoskeletal:        General: No swelling or deformity. Normal range of motion.     Comments: Diminished ROM of extension and flexion of right knee with clicking and mild crepitus.  Skin:    General: Skin is warm.     Capillary Refill: Capillary refill takes less than 2 seconds.  Neurological:     General: No focal deficit present.     Mental Status: He is alert and oriented to person, place, and time. Mental status is at baseline.  Psychiatric:        Mood and Affect: Mood normal.        Behavior: Behavior normal.        Thought Content: Thought content normal.        Judgment: Judgment normal.    No results found for any visits on 09/17/23.     The 10-year ASCVD risk score (Arnett DK, et al.,  2019) is: 14.7%    Assessment & Plan:   Problem List Items Addressed This Visit   None  Chronic pain of right knee -     Uric acid -     DG Knee Complete 4 Views Right; Future   Will screen for gout. Send for xrays and follow up pending results. May continue with his Mobic  that he uses for his back pain.  No follow-ups on file.    Suzan Slick, MD

## 2023-09-18 ENCOUNTER — Other Ambulatory Visit: Payer: Self-pay | Admitting: Family Medicine

## 2023-09-18 ENCOUNTER — Encounter: Payer: Self-pay | Admitting: Family Medicine

## 2023-09-18 DIAGNOSIS — G8929 Other chronic pain: Secondary | ICD-10-CM

## 2023-09-18 LAB — URIC ACID: Uric Acid: 6.8 mg/dL (ref 3.8–8.4)

## 2023-09-18 MED ORDER — INDOMETHACIN 25 MG PO CAPS: 25.0000 mg | ORAL_CAPSULE | Freq: Three times a day (TID) | ORAL | 0 refills | Status: DC | PRN

## 2023-09-20 ENCOUNTER — Encounter: Payer: Self-pay | Admitting: Internal Medicine

## 2023-10-04 ENCOUNTER — Other Ambulatory Visit (HOSPITAL_COMMUNITY): Payer: Self-pay

## 2023-10-17 ENCOUNTER — Ambulatory Visit (AMBULATORY_SURGERY_CENTER): Admitting: *Deleted

## 2023-10-17 VITALS — Ht 73.0 in | Wt 266.0 lb

## 2023-10-17 DIAGNOSIS — Z1211 Encounter for screening for malignant neoplasm of colon: Secondary | ICD-10-CM

## 2023-10-17 MED ORDER — NA SULFATE-K SULFATE-MG SULF 17.5-3.13-1.6 GM/177ML PO SOLN
1.0000 | Freq: Once | ORAL | 0 refills | Status: AC
Start: 2023-10-17 — End: 2023-10-17

## 2023-10-17 NOTE — Progress Notes (Signed)
 Pt's name and DOB verified at the beginning of the pre-visit wit 2 identifiers   Pt denies any difficulty with ambulating,sitting, laying down or rolling side to side  Pt has no issues with ambulation   Pt has no issues moving head neck or swallowing  No egg or soy allergy known to patient   No issues known to pt with past sedation with any surgeries or procedures  Pt denies having issues being intubated  No FH of Malignant Hyperthermia  Pt is not on diet pills or shots  Pt is not on home 02   Pt is not on blood thinners   Pt denies issues with constipation   Pt is not on dialysis  Pt denise any abnormal heart rhythms   Pt denies any upcoming cardiac testing  Patient's chart reviewed by Rogena Class CNRA prior to pre-visit and patient appropriate for the [facility].  Pre-visit completed and red dot placed by patient's name on their procedure day (on provider's schedule).    Chart not reviewed by CRNA prior to [facility]  Visit by phone  Pt states weight is 266 lb   IInstructions reviewed. Pt given , [facility] main # and MD on call # prior to instructions.  Pt states understanding of instructions. Instructed to review again prior to procedure. Pt states they will.

## 2023-10-22 ENCOUNTER — Encounter: Payer: Self-pay | Admitting: Internal Medicine

## 2023-10-23 ENCOUNTER — Other Ambulatory Visit: Payer: Self-pay | Admitting: Family Medicine

## 2023-10-23 DIAGNOSIS — M5440 Lumbago with sciatica, unspecified side: Secondary | ICD-10-CM

## 2023-10-24 ENCOUNTER — Encounter (HOSPITAL_COMMUNITY): Payer: Self-pay

## 2023-10-25 ENCOUNTER — Telehealth: Payer: Self-pay | Admitting: *Deleted

## 2023-10-25 ENCOUNTER — Encounter: Payer: Self-pay | Admitting: Internal Medicine

## 2023-10-25 NOTE — Telephone Encounter (Signed)
 Clarified with patient medications to hold and medications to continue. Pt stated he understood and has no other questions at this time.

## 2023-10-25 NOTE — Telephone Encounter (Signed)
 Attempted to reahc pt via phone LM with call back #

## 2023-10-25 NOTE — Telephone Encounter (Signed)
 Attempt to reach pt to assist with medication questions. LM with call back #

## 2023-10-28 ENCOUNTER — Encounter: Payer: 59 | Admitting: Family Medicine

## 2023-10-30 ENCOUNTER — Other Ambulatory Visit: Payer: Self-pay | Admitting: Family Medicine

## 2023-10-30 DIAGNOSIS — M5440 Lumbago with sciatica, unspecified side: Secondary | ICD-10-CM

## 2023-10-30 NOTE — Progress Notes (Unsigned)
 [facility] History and Physical   Primary Care Physician:  Manette Section, MD   Reason for Procedure:  Colon cancer screening  Plan:    Colonoscopy     HPI: Cameron Hunter is a 53 y.o. male with diabetes mellitus and hypertension presenting for a screening colonoscopy.   Past Medical History:  Diagnosis Date   Chronic fatigue syndrome    Diabetes mellitus without complication (HCC)    Gout    High blood pressure    Joint pain    Knee pain    Right knee    Knee pain     Past Surgical History:  Procedure Laterality Date   BACK SURGERY  10/2021    Prior to Admission medications   Medication Sig Start Date End Date Taking? Authorizing Provider  amLODipine  (NORVASC ) 10 MG tablet Take 1 tablet (10 mg total) by mouth daily. 05/06/23   Rucker, Izella Marshal, MD  cyclobenzaprine  (FLEXERIL ) 5 MG tablet TAKE 1 TABLET BY MOUTH 2 TIMES DAILY AS NEEDED FOR MUSCLE SPASMS. 10/23/23   Manette Section, MD  lisinopril  (ZESTRIL ) 30 MG tablet Take 1 tablet (30 mg total) by mouth daily. 05/06/23   Manette Section, MD  meloxicam  (MOBIC ) 15 MG tablet TAKE 1 TABLET BY MOUTH DAILY. 09/12/23   Manette Section, MD  pregabalin  (LYRICA ) 100 MG capsule Take 1 capsule (100 mg total) by mouth 2 (two) times daily. 04/24/23   Manette Section, MD  tirzepatide  (MOUNJARO ) 5 MG/0.5ML Pen Inject 5 mg into the skin once a week. 09/13/23   Manette Section, MD    Current Outpatient Medications  Medication Sig Dispense Refill   amLODipine  (NORVASC ) 10 MG tablet Take 1 tablet (10 mg total) by mouth daily. 90 tablet 1   lisinopril  (ZESTRIL ) 30 MG tablet Take 1 tablet (30 mg total) by mouth daily. 90 tablet 1   pregabalin  (LYRICA ) 100 MG capsule TAKE 1 CAPSULE BY MOUTH 2 TIMES DAILY. NEED FOLLOW UP WITH PRIMARY CARE PROVIDER FOR FURTHER REFILLS 60 capsule 1   meloxicam  (MOBIC ) 15 MG tablet TAKE 1 TABLET BY MOUTH DAILY. 30 tablet 2   tirzepatide  (MOUNJARO ) 5 MG/0.5ML Pen Inject 5 mg into the skin  once a week. 6 mL 0   Current Facility-Administered Medications  Medication Dose Route Frequency Provider Last Rate Last Admin   0.9 %  sodium chloride  infusion  500 mL Intravenous Once Kenney Peacemaker, MD        Allergies as of 10/31/2023   (No Known Allergies)    Family History  Problem Relation Age of Onset   Lung cancer Mother    Diabetes Mother    COPD Mother    Heart disease Mother    Kidney disease Mother    Thyroid disease Mother    Obesity Mother    Colon polyps Neg Hx    Colon cancer Neg Hx    Esophageal cancer Neg Hx    Stomach cancer Neg Hx    Rectal cancer Neg Hx     Social History   Socioeconomic History   Marital status: Married    Spouse name: Not on file   Number of children: Not on file   Years of education: Not on file   Highest education level: Associate degree: occupational, Scientist, product/process development, or vocational program  Occupational History   Not on file  Tobacco Use   Smoking status: Never   Smokeless tobacco: Never  Vaping Use   Vaping status: Never Used  Substance and Sexual Activity   Alcohol use: Not Currently    Comment: occasionally   Drug use: Never   Sexual activity: Not on file  Other Topics Concern   Not on file  Social History Narrative   Right handed    Caffeine occasional     Lives with wife at home    Social Drivers of Health   Financial Resource Strain: Low Risk  (09/16/2023)   Overall Financial Resource Strain (CARDIA)    Difficulty of Paying Living Expenses: Not hard at all  Food Insecurity: No Food Insecurity (09/16/2023)   Hunger Vital Sign    Worried About Running Out of Food in the Last Year: Never true    Ran Out of Food in the Last Year: Never true  Transportation Needs: No Transportation Needs (09/16/2023)   PRAPARE - Administrator, Civil Service (Medical): No    Lack of Transportation (Non-Medical): No  Physical Activity: Sufficiently Active (09/16/2023)   Exercise Vital Sign    Days of Exercise per Week:  5 days    Minutes of Exercise per Session: 100 min  Stress: No Stress Concern Present (09/16/2023)   [employer] of Occupational Health - Occupational Stress Questionnaire    Feeling of Stress : Only a little  Social Connections: Socially Integrated (09/16/2023)   Social Connection and Isolation Panel [NHANES]    Frequency of Communication with Friends and Family: More than three times a week    Frequency of Social Gatherings with Friends and Family: Once a week    Attends Religious Services: More than 4 times per year    Active Member of [employer] or Organizations: Yes    Attends Banker Meetings: 1 to 4 times per year    Marital Status: Married  Catering manager Violence: Unknown (09/22/2021)   Received from [employer], [facility]   HITS    Physically Hurt: Not on file    Insult or Talk Down To: Not on file    Threaten Physical Harm: Not on file    Scream or Curse: Not on file    Review of Systems:  All other review of systems negative except as mentioned in the HPI.  Physical Exam: Vital signs BP (!) 173/97 (BP Location: Right Arm, Patient Position: Sitting, Cuff Size: Normal) Comment: nervous  Pulse 85   Temp 98.1 F (36.7 C) (Temporal)   Ht 6\' 1"  (1.854 m)   Wt 266 lb (120.7 kg)   SpO2 98%   BMI 35.09 kg/m   General:   Alert,  Well-developed, well-nourished, pleasant and cooperative in NAD Lungs:  Clear throughout to auscultation.   Heart:  Regular rate and rhythm; no murmurs, clicks, rubs,  or gallops. Abdomen:  Soft, nontender and nondistended. Normal bowel sounds.   Neuro/Psych:  Alert and cooperative. Normal mood and affect. A and O x 3   @Jeanluc Wegman  Tammie Fall, MD, [facility] [phone] (pager) 10/31/2023 9:09 AM@

## 2023-10-31 ENCOUNTER — Ambulatory Visit (AMBULATORY_SURGERY_CENTER): Admitting: Internal Medicine

## 2023-10-31 ENCOUNTER — Encounter: Payer: Self-pay | Admitting: Internal Medicine

## 2023-10-31 VITALS — BP 112/76 | HR 64 | Temp 98.1°F | Resp 12 | Ht 73.0 in | Wt 266.0 lb

## 2023-10-31 DIAGNOSIS — Z1211 Encounter for screening for malignant neoplasm of colon: Secondary | ICD-10-CM

## 2023-10-31 DIAGNOSIS — K644 Residual hemorrhoidal skin tags: Secondary | ICD-10-CM

## 2023-10-31 DIAGNOSIS — K648 Other hemorrhoids: Secondary | ICD-10-CM

## 2023-10-31 DIAGNOSIS — K5730 Diverticulosis of large intestine without perforation or abscess without bleeding: Secondary | ICD-10-CM

## 2023-10-31 MED ORDER — SODIUM CHLORIDE 0.9 % IV SOLN: 500.0000 mL | Freq: Once | INTRAVENOUS | Status: DC

## 2023-10-31 NOTE — Op Note (Signed)
 [facility] Patient Name: Cameron Hunter Procedure Date: 10/31/2023 9:08 AM MRN: [id number] Endoscopist: Kenney Peacemaker , MD, [id number] Age: 53 Referring MD:  Date of Birth: 1971-05-21 Gender: Male Account #: [id number] Procedure:                Colonoscopy Indications:              Screening for colorectal malignant neoplasm, This                            is the patient's first colonoscopy Medicines:                Monitored Anesthesia Care Procedure:                Pre-Anesthesia Assessment:                           - Prior to the procedure, a History and Physical                            was performed, and patient medications and                            allergies were reviewed. The patient's tolerance of                            previous anesthesia was also reviewed. The risks                            and benefits of the procedure and the sedation                            options and risks were discussed with the patient.                            All questions were answered, and informed consent                            was obtained. Prior Anticoagulants: The patient has                            taken no anticoagulant or antiplatelet agents. ASA                            Grade Assessment: III - A patient with severe                            systemic disease. After reviewing the risks and                            benefits, the patient was deemed in satisfactory                            condition to undergo the procedure.  After obtaining informed consent, the colonoscope                            was passed under direct vision. Throughout the                            procedure, the patient's blood pressure, pulse, and                            oxygen saturations were monitored continuously. The                            Olympus CF-HQ190L ([id number]) Colonoscope was                            introduced through the anus  and advanced to the the                            cecum, identified by appendiceal orifice and                            ileocecal valve. The colonoscopy was performed                            without difficulty. The patient tolerated the                            procedure well. The quality of the bowel                            preparation was good. The ileocecal valve,                            appendiceal orifice, and rectum were photographed.                            The bowel preparation used was Miralax via split                            dose instruction. Scope In: 9:22:33 AM Scope Out: 9:35:31 AM Scope Withdrawal Time: 0 hours 11 minutes 3 seconds  Total Procedure Duration: 0 hours 12 minutes 58 seconds  Findings:                 The perianal and digital rectal examinations were                            normal.                           A few small-mouthed diverticula were found in the                            sigmoid colon.  External and internal hemorrhoids were found. The                            hemorrhoids were small.                           The exam was otherwise without abnormality on                            direct and retroflexion views. Complications:            No immediate complications. Estimated Blood Loss:     Estimated blood loss: none. Impression:               - Diverticulosis in the sigmoid colon.                           - External and internal hemorrhoids.                           - The examination was otherwise normal on direct                            and retroflexion views.                           - No specimens collected. Recommendation:           - Patient has a contact number available for                            emergencies. The signs and symptoms of potential                            delayed complications were discussed with the                            patient. Return to normal activities  tomorrow.                            Written discharge instructions were provided to the                            patient.                           - Resume previous diet.                           - Continue present medications.                           - Repeat colonoscopy in 10 years for screening                            purposes. Kenney Peacemaker, MD 10/31/2023 9:42:39 AM This report has been signed electronically.

## 2023-10-31 NOTE — Patient Instructions (Addendum)
 No polyps or cancer were seen.  Next routine colonoscopy or other screening test in 10 years - 2035.  You do have some diverticulosis and your hemorrhoids were swollen (likely from the prep). Please read the handouts.  I appreciate the opportunity to care for you. Kenney Peacemaker, MD, FACG  YOU HAD AN ENDOSCOPIC PROCEDURE TODAY AT THE [facility]:   Refer to the procedure report that was given to you for any specific questions about what was found during the examination.  If the procedure report does not answer your questions, please call your gastroenterologist to clarify.  If you requested that your care partner not be given the details of your procedure findings, then the procedure report has been included in a sealed envelope for you to review at your convenience later.  YOU SHOULD EXPECT: Some feelings of bloating in the abdomen. Passage of more gas than usual.  Walking can help get rid of the air that was put into your GI tract during the procedure and reduce the bloating. If you had a lower endoscopy (such as a colonoscopy or flexible sigmoidoscopy) you may notice spotting of blood in your stool or on the toilet paper. If you underwent a bowel prep for your procedure, you may not have a normal bowel movement for a few days.  Please Note:  You might notice some irritation and congestion in your nose or some drainage.  This is from the oxygen used during your procedure.  There is no need for concern and it should clear up in a day or so.  SYMPTOMS TO REPORT IMMEDIATELY:  Following lower endoscopy (colonoscopy or flexible sigmoidoscopy):  Excessive amounts of blood in the stool  Significant tenderness or worsening of abdominal pains  Swelling of the abdomen that is new, acute  Fever of 100F or higher  For urgent or emergent issues, a gastroenterologist can be reached at any hour by calling [phone]. Do not use MyChart messaging for urgent concerns.    DIET:  We do  recommend a small meal at first, but then you may proceed to your regular diet.  Drink plenty of fluids but you should avoid alcoholic beverages for 24 hours.  ACTIVITY:  You should plan to take it easy for the rest of today and you should NOT DRIVE or use heavy machinery until tomorrow (because of the sedation medicines used during the test).    FOLLOW UP: Our staff will call the number listed on your records the next business day following your procedure.  We will call around 7:15- 8:00 am to check on you and address any questions or concerns that you may have regarding the information given to you following your procedure. If we do not reach you, we will leave a message.     If any biopsies were taken you will be contacted by phone or by letter within the next 1-3 weeks.  Please call us  at [phone] if you have not heard about the biopsies in 3 weeks.    SIGNATURES/CONFIDENTIALITY: You and/or your care partner have signed paperwork which will be entered into your electronic medical record.  These signatures attest to the fact that that the information above on your After Visit Summary has been reviewed and is understood.  Full responsibility of the confidentiality of this discharge information lies with you and/or your care-partner.

## 2023-10-31 NOTE — Progress Notes (Signed)
 Sedate, gd SR, tolerated procedure well, VSS, report to RN

## 2023-10-31 NOTE — Progress Notes (Signed)
 Vitals-Cameron Hunter

## 2023-10-31 NOTE — Progress Notes (Signed)
 Hillery Lown, RN  Pt's states no medical or surgical changes since previsit or office visit.

## 2023-11-01 ENCOUNTER — Telehealth: Payer: Self-pay | Admitting: Lactation Services

## 2023-11-01 NOTE — Telephone Encounter (Signed)
 Message left for patient to call if necessary for questions/concerns

## 2023-11-04 ENCOUNTER — Other Ambulatory Visit (HOSPITAL_COMMUNITY): Payer: Self-pay

## 2023-11-06 ENCOUNTER — Encounter: Admitting: Family Medicine

## 2023-11-08 ENCOUNTER — Other Ambulatory Visit: Payer: Self-pay | Admitting: Family Medicine

## 2023-11-08 DIAGNOSIS — R7302 Impaired glucose tolerance (oral): Secondary | ICD-10-CM

## 2023-11-14 ENCOUNTER — Encounter: Payer: Self-pay | Admitting: Family Medicine

## 2023-11-14 ENCOUNTER — Ambulatory Visit (INDEPENDENT_AMBULATORY_CARE_PROVIDER_SITE_OTHER): Admitting: Family Medicine

## 2023-11-14 VITALS — BP 106/67 | HR 77 | Temp 97.8°F | Resp 18 | Ht 73.0 in | Wt 259.8 lb

## 2023-11-14 DIAGNOSIS — Z125 Encounter for screening for malignant neoplasm of prostate: Secondary | ICD-10-CM

## 2023-11-14 DIAGNOSIS — Z136 Encounter for screening for cardiovascular disorders: Secondary | ICD-10-CM

## 2023-11-14 DIAGNOSIS — Z0000 Encounter for general adult medical examination without abnormal findings: Secondary | ICD-10-CM

## 2023-11-14 DIAGNOSIS — Z23 Encounter for immunization: Secondary | ICD-10-CM

## 2023-11-14 DIAGNOSIS — Z13220 Encounter for screening for lipoid disorders: Secondary | ICD-10-CM | POA: Diagnosis not present

## 2023-11-14 DIAGNOSIS — Z7985 Long-term (current) use of injectable non-insulin antidiabetic drugs: Secondary | ICD-10-CM

## 2023-11-14 DIAGNOSIS — E119 Type 2 diabetes mellitus without complications: Secondary | ICD-10-CM

## 2023-11-14 NOTE — Progress Notes (Signed)
 Complete physical exam  Patient: Cameron Hunter   DOB: 1970-09-27   53 y.o. Male  MRN: [id number]  Subjective:     No chief complaint on file.   Cameron Hunter is a 53 y.o. male who presents today for a complete physical exam. He reports consuming a low carbohydrate diet. Walking average 10, 000 a day and every other day He generally feels well. He reports sleeping well. He does not have additional problems to discuss today.    Most recent fall risk assessment:    08/06/2022    4:10 PM  Fall Risk   Falls in the past year? 0  Number falls in past yr: 0  Injury with Fall? 0  Risk for fall due to : No Fall Risks  Follow up Falls evaluation completed     Most recent depression screenings:    08/06/2022    4:10 PM 11/21/2021    2:15 PM  PHQ 2/9 Scores  PHQ - 2 Score 0 2  PHQ- 9 Score 1 7    Vision:Within last year  Patient Active Problem List   Diagnosis Date Noted   Elevated cholesterol 12/05/2021   Elevated liver enzymes 11/22/2021   Gout 11/21/2021   Arthritis 11/21/2021   Vitamin D  deficiency 11/21/2021   Primary hypertension 01/04/2021   Lumbar radiculopathy 05/14/2018   Past Medical History:  Diagnosis Date   Chronic fatigue syndrome    Diabetes mellitus without complication (HCC)    Gout    High blood pressure    Joint pain    Knee pain    Right knee    Knee pain    Past Surgical History:  Procedure Laterality Date   BACK SURGERY  10/2021   Social History   Socioeconomic History   Marital status: Married    Spouse name: Not on file   Number of children: Not on file   Years of education: Not on file   Highest education level: Associate degree: occupational, Scientist, product/process development, or vocational program  Occupational History   Not on file  Tobacco Use   Smoking status: Never   Smokeless tobacco: Never  Vaping Use   Vaping status: Never Used  Substance and Sexual Activity   Alcohol use: Not Currently    Comment: occasionally   Drug use: Never   Sexual  activity: Not on file  Other Topics Concern   Not on file  Social History Narrative   Right handed    Caffeine occasional     Lives with wife at home    Social Drivers of Health   Financial Resource Strain: Low Risk  (09/16/2023)   Overall Financial Resource Strain (CARDIA)    Difficulty of Paying Living Expenses: Not hard at all  Food Insecurity: No Food Insecurity (09/16/2023)   Hunger Vital Sign    Worried About Running Out of Food in the Last Year: Never true    Ran Out of Food in the Last Year: Never true  Transportation Needs: No Transportation Needs (09/16/2023)   PRAPARE - Administrator, Civil Service (Medical): No    Lack of Transportation (Non-Medical): No  Physical Activity: Sufficiently Active (09/16/2023)   Exercise Vital Sign    Days of Exercise per Week: 5 days    Minutes of Exercise per Session: 100 min  Stress: No Stress Concern Present (09/16/2023)   [employer] of Occupational Health - Occupational Stress Questionnaire    Feeling of Stress : Only a little  Social Connections:  Socially Integrated (09/16/2023)   Social Connection and Isolation Panel [NHANES]    Frequency of Communication with Friends and Family: More than three times a week    Frequency of Social Gatherings with Friends and Family: Once a week    Attends Religious Services: More than 4 times per year    Active Member of [employer] or Organizations: Yes    Attends Banker Meetings: 1 to 4 times per year    Marital Status: Married  Catering manager Violence: Unknown (09/22/2021)   Received from [employer], [facility]   HITS    Physically Hurt: Not on file    Insult or Talk Down To: Not on file    Threaten Physical Harm: Not on file    Scream or Curse: Not on file   Family History  Problem Relation Age of Onset   Lung cancer Mother    Diabetes Mother    COPD Mother    Heart disease Mother    Kidney disease Mother    Thyroid disease Mother    Obesity Mother     Colon polyps Neg Hx    Colon cancer Neg Hx    Esophageal cancer Neg Hx    Stomach cancer Neg Hx    Rectal cancer Neg Hx    No Known Allergies    Patient Care Team: Manette Section, MD as PCP - General (Family Medicine)   Outpatient Medications Prior to Visit  Medication Sig   amLODipine  (NORVASC ) 10 MG tablet Take 1 tablet (10 mg total) by mouth daily.   lisinopril  (ZESTRIL ) 30 MG tablet Take 1 tablet (30 mg total) by mouth daily.   meloxicam  (MOBIC ) 15 MG tablet TAKE 1 TABLET BY MOUTH DAILY.   pregabalin  (LYRICA ) 100 MG capsule TAKE 1 CAPSULE BY MOUTH 2 TIMES DAILY. NEED FOLLOW UP WITH PRIMARY CARE PROVIDER FOR FURTHER REFILLS   tirzepatide  (MOUNJARO ) 5 MG/0.5ML Pen Inject 5 mg into the skin once a week.   No facility-administered medications prior to visit.    Review of Systems  All other systems reviewed and are negative.         Objective:     There were no vitals taken for this visit. BP Readings from Last 3 Encounters:  10/31/23 112/76  09/17/23 139/81  05/06/23 138/83      Physical Exam Vitals and nursing note reviewed.  Constitutional:      Appearance: Normal appearance. He is normal weight.  HENT:     Head: Normocephalic and atraumatic.     Right Ear: Tympanic membrane, ear canal and external ear normal.     Left Ear: Tympanic membrane, ear canal and external ear normal.     Nose: Nose normal.     Mouth/Throat:     Mouth: Mucous membranes are moist.     Pharynx: Oropharynx is clear.  Eyes:     Conjunctiva/sclera: Conjunctivae normal.     Pupils: Pupils are equal, round, and reactive to light.  Cardiovascular:     Rate and Rhythm: Normal rate and regular rhythm.     Pulses: Normal pulses.     Heart sounds: Normal heart sounds.  Pulmonary:     Effort: Pulmonary effort is normal.     Breath sounds: Normal breath sounds.  Abdominal:     General: Abdomen is flat. Bowel sounds are normal.  Skin:    General: Skin is warm.     Capillary Refill:  Capillary refill takes less than 2 seconds.  Neurological:     General: No focal deficit present.     Mental Status: He is alert and oriented to person, place, and time. Mental status is at baseline.  Psychiatric:        Mood and Affect: Mood normal.        Behavior: Behavior normal.        Thought Content: Thought content normal.        Judgment: Judgment normal.     No results found for any visits on 11/14/23. Last CBC Lab Results  Component Value Date   WBC 4.3 10/26/2022   HGB 16.5 10/26/2022   HCT 48.4 10/26/2022   MCV 85 10/26/2022   MCH 28.8 10/26/2022   RDW 13.4 10/26/2022   PLT 222 10/26/2022   Last metabolic panel Lab Results  Component Value Date   GLUCOSE 115 (H) 05/06/2023   NA 141 05/06/2023   K 4.7 05/06/2023   CL 103 05/06/2023   CO2 22 05/06/2023   BUN 19 05/06/2023   CREATININE 1.14 05/06/2023   EGFR 77 05/06/2023   CALCIUM 9.9 05/06/2023   PROT 7.6 10/26/2022   ALBUMIN 4.9 10/26/2022   LABGLOB 2.7 10/26/2022   AGRATIO 1.8 10/26/2022   BILITOT 0.9 10/26/2022   ALKPHOS 71 10/26/2022   AST 33 10/26/2022   ALT 38 10/26/2022   Last lipids Lab Results  Component Value Date   CHOL 201 (H) 10/26/2022   HDL 43 10/26/2022   LDLCALC 132 (H) 10/26/2022   TRIG 143 10/26/2022   CHOLHDL 4.7 10/26/2022   Last hemoglobin A1c Lab Results  Component Value Date   HGBA1C 6.4 (H) 05/06/2023        Assessment & Plan:    Routine Health Maintenance and Physical Exam  Immunization History  Administered Date(s) Administered   PFIZER(Purple Top)SARS-COV-2 Vaccination 08/22/2019, 09/12/2019   Tdap 03/24/2012    Health Maintenance  Topic Date Due   Pneumococcal Vaccine 33-62 Years old (1 of 2 - PCV) Never done   Zoster Vaccines- Shingrix (1 of 2) Never done   DTaP/Tdap/Td (2 - Td or Tdap) 03/24/2022   OPHTHALMOLOGY EXAM  03/13/2023   FOOT EXAM  10/26/2023   HEMOGLOBIN A1C  11/03/2023   INFLUENZA VACCINE  01/17/2024   Diabetic kidney evaluation -  eGFR measurement  05/05/2024   Diabetic kidney evaluation - Urine ACR  05/05/2024   Colonoscopy  10/30/2033   Hepatitis C Screening  Completed   HIV Screening  Completed   HPV VACCINES  Aged Out   Meningococcal B Vaccine  Aged Out   COVID-19 Vaccine  Discontinued    Discussed health benefits of physical activity, and encouraged him to engage in regular exercise appropriate for his age and condition.  Problem List Items Addressed This Visit   None Visit Diagnoses       Annual physical exam    -  Primary     Type 2 diabetes mellitus without complication, without long-term current use of insulin  (HCC)         Encounter for lipid screening for cardiovascular disease         Screening PSA (prostate specific antigen)          No follow-ups on file. Annual physical exam  Type 2 diabetes mellitus without complication, without long-term current use of insulin  (HCC) -     CBC with Differential/Platelet -     Comprehensive metabolic panel with GFR -     Hemoglobin A1c  Encounter for lipid  screening for cardiovascular disease -     Lipid panel  Screening PSA (prostate specific antigen) -     PSA  Need for vaccination against Streptococcus pneumoniae -     Pneumococcal conjugate vaccine 20-valent   Screening CPE labs today PCV-20 today See in 6 months    Reeves Musick Ceil Coho, MD

## 2023-11-15 LAB — COMPREHENSIVE METABOLIC PANEL WITH GFR
ALT: 21 [IU]/L (ref 0–44)
AST: 25 [IU]/L (ref 0–40)
Albumin: 4.7 g/dL (ref 3.8–4.9)
Alkaline Phosphatase: 69 [IU]/L (ref 44–121)
BUN/Creatinine Ratio: 17 (ref 9–20)
BUN: 18 mg/dL (ref 6–24)
Bilirubin Total: 0.5 mg/dL (ref 0.0–1.2)
CO2: 19 mmol/L — ABNORMAL LOW (ref 20–29)
Calcium: 9.6 mg/dL (ref 8.7–10.2)
Chloride: 103 mmol/L (ref 96–106)
Creatinine, Ser: 1.09 mg/dL (ref 0.76–1.27)
Globulin, Total: 2.4 g/dL (ref 1.5–4.5)
Glucose: 93 mg/dL (ref 70–99)
Potassium: 4.6 mmol/L (ref 3.5–5.2)
Sodium: 140 mmol/L (ref 134–144)
Total Protein: 7.1 g/dL (ref 6.0–8.5)
eGFR: 81 mL/min/{1.73_m2}

## 2023-11-15 LAB — CBC WITH DIFFERENTIAL/PLATELET
Basophils Absolute: 0 10*3/uL (ref 0.0–0.2)
Basos: 1 %
EOS (ABSOLUTE): 0 10*3/uL (ref 0.0–0.4)
Eos: 1 %
Hematocrit: 46.2 % (ref 37.5–51.0)
Hemoglobin: 14.9 g/dL (ref 13.0–17.7)
Immature Grans (Abs): 0 10*3/uL (ref 0.0–0.1)
Immature Granulocytes: 0 %
Lymphocytes Absolute: 1 10*3/uL (ref 0.7–3.1)
Lymphs: 26 %
MCH: 29 pg (ref 26.6–33.0)
MCHC: 32.3 g/dL (ref 31.5–35.7)
MCV: 90 fL (ref 79–97)
Monocytes Absolute: 0.3 10*3/uL (ref 0.1–0.9)
Monocytes: 7 %
Neutrophils Absolute: 2.5 10*3/uL (ref 1.4–7.0)
Neutrophils: 65 %
Platelets: 209 10*3/uL (ref 150–450)
RBC: 5.13 x10E6/uL (ref 4.14–5.80)
RDW: 13.5 % (ref 11.6–15.4)
WBC: 3.9 10*3/uL (ref 3.4–10.8)

## 2023-11-15 LAB — HEMOGLOBIN A1C
Est. average glucose Bld gHb Est-mCnc: 114 mg/dL
Hgb A1c MFr Bld: 5.6 % (ref 4.8–5.6)

## 2023-11-15 LAB — LIPID PANEL
Chol/HDL Ratio: 3.4 ratio (ref 0.0–5.0)
Cholesterol, Total: 162 mg/dL (ref 100–199)
HDL: 47 mg/dL
LDL Chol Calc (NIH): 101 mg/dL — ABNORMAL HIGH (ref 0–99)
Triglycerides: 72 mg/dL (ref 0–149)
VLDL Cholesterol Cal: 14 mg/dL (ref 5–40)

## 2023-11-15 LAB — PSA: Prostate Specific Ag, Serum: 0.5 ng/mL (ref 0.0–4.0)

## 2023-11-18 ENCOUNTER — Ambulatory Visit: Payer: Self-pay | Admitting: Family Medicine

## 2023-11-28 ENCOUNTER — Other Ambulatory Visit: Payer: Self-pay | Admitting: Family Medicine

## 2023-11-28 DIAGNOSIS — I10 Essential (primary) hypertension: Secondary | ICD-10-CM

## 2023-12-08 ENCOUNTER — Other Ambulatory Visit: Payer: Self-pay | Admitting: Family Medicine

## 2023-12-08 DIAGNOSIS — R7303 Prediabetes: Secondary | ICD-10-CM

## 2023-12-08 DIAGNOSIS — Z6838 Body mass index (BMI) 38.0-38.9, adult: Secondary | ICD-10-CM

## 2023-12-08 DIAGNOSIS — I10 Essential (primary) hypertension: Secondary | ICD-10-CM

## 2023-12-09 ENCOUNTER — Other Ambulatory Visit (HOSPITAL_COMMUNITY): Payer: Self-pay

## 2023-12-09 MED ORDER — MOUNJARO 5 MG/0.5ML ~~LOC~~ SOAJ
5.0000 mg | SUBCUTANEOUS | 0 refills | Status: DC
  Filled 2023-12-09: qty 2, 28d supply, fill #0
  Filled 2023-12-31: qty 2, 28d supply, fill #1
  Filled 2024-01-30: qty 2, 28d supply, fill #2

## 2023-12-10 ENCOUNTER — Other Ambulatory Visit (HOSPITAL_COMMUNITY): Payer: Self-pay

## 2023-12-12 ENCOUNTER — Ambulatory Visit

## 2023-12-19 ENCOUNTER — Ambulatory Visit: Admitting: Family Medicine

## 2023-12-24 ENCOUNTER — Other Ambulatory Visit: Payer: Self-pay | Admitting: Family Medicine

## 2023-12-24 DIAGNOSIS — M5440 Lumbago with sciatica, unspecified side: Secondary | ICD-10-CM

## 2023-12-30 ENCOUNTER — Ambulatory Visit: Admitting: Family Medicine

## 2023-12-31 ENCOUNTER — Encounter: Payer: Self-pay | Admitting: Family Medicine

## 2023-12-31 DIAGNOSIS — G8929 Other chronic pain: Secondary | ICD-10-CM

## 2024-01-01 ENCOUNTER — Other Ambulatory Visit (HOSPITAL_COMMUNITY): Payer: Self-pay

## 2024-01-01 MED ORDER — INDOMETHACIN 25 MG PO CAPS
25.0000 mg | ORAL_CAPSULE | Freq: Three times a day (TID) | ORAL | 0 refills | Status: AC | PRN
Start: 2024-01-01 — End: ?

## 2024-01-09 ENCOUNTER — Ambulatory Visit

## 2024-01-30 ENCOUNTER — Other Ambulatory Visit: Payer: Self-pay

## 2024-03-03 ENCOUNTER — Other Ambulatory Visit: Payer: Self-pay | Admitting: Family Medicine

## 2024-03-03 ENCOUNTER — Other Ambulatory Visit: Payer: Self-pay

## 2024-03-03 ENCOUNTER — Other Ambulatory Visit (HOSPITAL_COMMUNITY): Payer: Self-pay

## 2024-03-03 DIAGNOSIS — R7303 Prediabetes: Secondary | ICD-10-CM

## 2024-03-03 DIAGNOSIS — I10 Essential (primary) hypertension: Secondary | ICD-10-CM

## 2024-03-03 DIAGNOSIS — Z6838 Body mass index (BMI) 38.0-38.9, adult: Secondary | ICD-10-CM

## 2024-03-03 MED ORDER — MOUNJARO 5 MG/0.5ML ~~LOC~~ SOAJ
5.0000 mg | SUBCUTANEOUS | 0 refills | Status: DC
  Filled 2024-03-03: qty 2, 28d supply, fill #0
  Filled 2024-03-27: qty 2, 28d supply, fill #1
  Filled 2024-04-30 – 2024-05-05 (×2): qty 2, 28d supply, fill #2

## 2024-03-27 ENCOUNTER — Other Ambulatory Visit (HOSPITAL_COMMUNITY): Payer: Self-pay

## 2024-04-01 ENCOUNTER — Encounter: Payer: Self-pay | Admitting: Family Medicine

## 2024-04-03 ENCOUNTER — Other Ambulatory Visit: Payer: Self-pay | Admitting: Family Medicine

## 2024-04-03 DIAGNOSIS — M5440 Lumbago with sciatica, unspecified side: Secondary | ICD-10-CM

## 2024-04-03 DIAGNOSIS — G8929 Other chronic pain: Secondary | ICD-10-CM

## 2024-04-03 MED ORDER — INDOMETHACIN 25 MG PO CAPS: 25.0000 mg | ORAL_CAPSULE | Freq: Three times a day (TID) | ORAL | 0 refills | Status: AC | PRN

## 2024-04-03 MED ORDER — MELOXICAM 15 MG PO TABS: 15.0000 mg | ORAL_TABLET | Freq: Every day | ORAL | 0 refills | Status: AC

## 2024-04-30 ENCOUNTER — Other Ambulatory Visit (HOSPITAL_COMMUNITY): Payer: Self-pay

## 2024-04-30 ENCOUNTER — Other Ambulatory Visit: Payer: Self-pay

## 2024-04-30 ENCOUNTER — Encounter: Payer: Self-pay | Admitting: Pharmacist

## 2024-05-05 ENCOUNTER — Other Ambulatory Visit (HOSPITAL_COMMUNITY): Payer: Self-pay

## 2024-05-05 ENCOUNTER — Other Ambulatory Visit: Payer: Self-pay

## 2024-05-18 ENCOUNTER — Ambulatory Visit: Admitting: Family Medicine

## 2024-05-30 ENCOUNTER — Other Ambulatory Visit: Payer: Self-pay | Admitting: Family Medicine

## 2024-05-30 DIAGNOSIS — Z6838 Body mass index (BMI) 38.0-38.9, adult: Secondary | ICD-10-CM

## 2024-05-30 DIAGNOSIS — I10 Essential (primary) hypertension: Secondary | ICD-10-CM

## 2024-05-30 DIAGNOSIS — R7303 Prediabetes: Secondary | ICD-10-CM

## 2024-05-31 ENCOUNTER — Other Ambulatory Visit (HOSPITAL_COMMUNITY): Payer: Self-pay

## 2024-05-31 MED ORDER — MOUNJARO 5 MG/0.5ML ~~LOC~~ SOAJ
5.0000 mg | SUBCUTANEOUS | 0 refills | Status: AC
  Filled 2024-05-31 – 2024-06-09 (×2): qty 2, 28d supply, fill #0
  Filled 2024-06-28: qty 2, 28d supply, fill #1

## 2024-06-03 ENCOUNTER — Other Ambulatory Visit: Payer: Self-pay

## 2024-06-03 ENCOUNTER — Encounter: Payer: Self-pay | Admitting: Pharmacist

## 2024-06-04 ENCOUNTER — Other Ambulatory Visit: Payer: Self-pay | Admitting: Family Medicine

## 2024-06-04 ENCOUNTER — Other Ambulatory Visit (HOSPITAL_COMMUNITY): Payer: Self-pay

## 2024-06-04 DIAGNOSIS — M5440 Lumbago with sciatica, unspecified side: Secondary | ICD-10-CM

## 2024-06-08 ENCOUNTER — Other Ambulatory Visit: Payer: Self-pay

## 2024-06-09 ENCOUNTER — Other Ambulatory Visit (HOSPITAL_COMMUNITY): Payer: Self-pay

## 2024-06-26 ENCOUNTER — Other Ambulatory Visit: Payer: Self-pay | Admitting: Family Medicine

## 2024-06-26 DIAGNOSIS — M5440 Lumbago with sciatica, unspecified side: Secondary | ICD-10-CM

## 2024-06-26 DIAGNOSIS — I10 Essential (primary) hypertension: Secondary | ICD-10-CM

## 2024-06-29 ENCOUNTER — Other Ambulatory Visit: Payer: Self-pay | Admitting: Family Medicine

## 2024-06-29 ENCOUNTER — Other Ambulatory Visit (HOSPITAL_COMMUNITY): Payer: Self-pay

## 2024-06-29 DIAGNOSIS — M5440 Lumbago with sciatica, unspecified side: Secondary | ICD-10-CM

## 2024-07-01 ENCOUNTER — Other Ambulatory Visit: Payer: Self-pay | Admitting: Family Medicine

## 2024-07-01 DIAGNOSIS — I10 Essential (primary) hypertension: Secondary | ICD-10-CM

## 2024-07-01 DIAGNOSIS — M5440 Lumbago with sciatica, unspecified side: Secondary | ICD-10-CM

## 2024-07-01 MED ORDER — LISINOPRIL 30 MG PO TABS: 30.0000 mg | ORAL_TABLET | Freq: Every day | ORAL | 0 refills | Status: AC

## 2024-07-01 MED ORDER — PREGABALIN 100 MG PO CAPS: 100.0000 mg | ORAL_CAPSULE | Freq: Two times a day (BID) | ORAL | 0 refills | Status: AC

## 2024-07-02 ENCOUNTER — Other Ambulatory Visit (HOSPITAL_COMMUNITY): Payer: Self-pay
